# Patient Record
Sex: Male | Born: 1935 | Race: White | Hispanic: No | Marital: Married | Smoking: Never smoker
Health system: Southern US, Community
[De-identification: ages and names within clinical notes are randomized; demographics above are authoritative.]

## PROBLEM LIST (undated history)

## (undated) DIAGNOSIS — E119 Type 2 diabetes mellitus without complications: Secondary | ICD-10-CM

## (undated) DIAGNOSIS — N189 Chronic kidney disease, unspecified: Secondary | ICD-10-CM

## (undated) DIAGNOSIS — E785 Hyperlipidemia, unspecified: Secondary | ICD-10-CM

## (undated) DIAGNOSIS — I252 Old myocardial infarction: Secondary | ICD-10-CM

## (undated) DIAGNOSIS — I10 Essential (primary) hypertension: Secondary | ICD-10-CM

## (undated) DIAGNOSIS — D649 Anemia, unspecified: Secondary | ICD-10-CM

## (undated) HISTORY — PX: THUMB AMPUTATION: SHX804

---

## 2005-01-23 ENCOUNTER — Ambulatory Visit: Payer: Self-pay | Admitting: General Surgery

## 2005-01-23 ENCOUNTER — Other Ambulatory Visit: Payer: Self-pay

## 2005-01-25 ENCOUNTER — Ambulatory Visit: Payer: Self-pay | Admitting: General Surgery

## 2005-04-12 ENCOUNTER — Ambulatory Visit: Payer: Self-pay

## 2006-10-08 ENCOUNTER — Emergency Department: Payer: Self-pay

## 2006-10-08 ENCOUNTER — Other Ambulatory Visit: Payer: Self-pay

## 2006-10-10 ENCOUNTER — Ambulatory Visit: Payer: Self-pay

## 2008-12-15 ENCOUNTER — Ambulatory Visit: Payer: Self-pay | Admitting: Family Medicine

## 2009-11-27 ENCOUNTER — Ambulatory Visit: Payer: Self-pay | Admitting: Urology

## 2011-10-22 ENCOUNTER — Ambulatory Visit: Payer: Self-pay | Admitting: Family Medicine

## 2012-10-26 ENCOUNTER — Ambulatory Visit: Payer: Self-pay | Admitting: Ophthalmology

## 2012-10-26 LAB — CBC WITH DIFFERENTIAL/PLATELET
Basophil %: 0.7 %
Eosinophil %: 4.6 %
HCT: 32 % — ABNORMAL LOW (ref 40.0–52.0)
HGB: 10.9 g/dL — ABNORMAL LOW (ref 13.0–18.0)
Lymphocyte #: 1.5 10*3/uL (ref 1.0–3.6)
Lymphocyte %: 24.5 %
MCH: 35 pg — ABNORMAL HIGH (ref 26.0–34.0)
Monocyte %: 6.6 %
Neutrophil #: 4 10*3/uL (ref 1.4–6.5)
Platelet: 179 10*3/uL (ref 150–440)
WBC: 6.3 10*3/uL (ref 3.8–10.6)

## 2012-11-02 ENCOUNTER — Ambulatory Visit: Payer: Self-pay | Admitting: Ophthalmology

## 2013-03-10 ENCOUNTER — Ambulatory Visit: Payer: Self-pay | Admitting: Family Medicine

## 2014-02-09 ENCOUNTER — Ambulatory Visit: Payer: Self-pay | Admitting: Family Medicine

## 2014-04-12 ENCOUNTER — Inpatient Hospital Stay: Payer: Self-pay | Admitting: Internal Medicine

## 2014-04-12 DIAGNOSIS — I440 Atrioventricular block, first degree: Secondary | ICD-10-CM

## 2014-04-12 DIAGNOSIS — R748 Abnormal levels of other serum enzymes: Secondary | ICD-10-CM

## 2014-04-12 DIAGNOSIS — I951 Orthostatic hypotension: Secondary | ICD-10-CM

## 2014-04-12 DIAGNOSIS — I10 Essential (primary) hypertension: Secondary | ICD-10-CM

## 2014-04-12 LAB — COMPREHENSIVE METABOLIC PANEL
ALBUMIN: 3 g/dL — AB (ref 3.4–5.0)
Alkaline Phosphatase: 96 U/L
Anion Gap: 5 — ABNORMAL LOW (ref 7–16)
BUN: 28 mg/dL — ABNORMAL HIGH (ref 7–18)
Bilirubin,Total: 0.5 mg/dL (ref 0.2–1.0)
CALCIUM: 8.7 mg/dL (ref 8.5–10.1)
CO2: 24 mmol/L (ref 21–32)
CREATININE: 1.37 mg/dL — AB (ref 0.60–1.30)
Chloride: 108 mmol/L — ABNORMAL HIGH (ref 98–107)
EGFR (African American): 55 — ABNORMAL LOW
EGFR (Non-African Amer.): 47 — ABNORMAL LOW
GLUCOSE: 108 mg/dL — AB (ref 65–99)
OSMOLALITY: 280 (ref 275–301)
Potassium: 5.2 mmol/L — ABNORMAL HIGH (ref 3.5–5.1)
SGOT(AST): 80 U/L — ABNORMAL HIGH (ref 15–37)
SGPT (ALT): 25 U/L (ref 12–78)
Sodium: 137 mmol/L (ref 136–145)
Total Protein: 6.3 g/dL — ABNORMAL LOW (ref 6.4–8.2)

## 2014-04-12 LAB — URINALYSIS, COMPLETE
BACTERIA: NONE SEEN
BILIRUBIN, UR: NEGATIVE
Glucose,UR: NEGATIVE mg/dL (ref 0–75)
Leukocyte Esterase: NEGATIVE
Nitrite: NEGATIVE
PH: 5 (ref 4.5–8.0)
PROTEIN: NEGATIVE
Specific Gravity: 1.017 (ref 1.003–1.030)
Squamous Epithelial: NONE SEEN
WBC UR: NONE SEEN /HPF (ref 0–5)

## 2014-04-12 LAB — TROPONIN I
Troponin-I: 0.18 ng/mL — ABNORMAL HIGH
Troponin-I: 0.25 ng/mL — ABNORMAL HIGH
Troponin-I: 0.29 ng/mL — ABNORMAL HIGH

## 2014-04-12 LAB — CBC
HCT: 28.3 % — ABNORMAL LOW (ref 40.0–52.0)
HGB: 9.6 g/dL — ABNORMAL LOW (ref 13.0–18.0)
MCH: 35.3 pg — AB (ref 26.0–34.0)
MCHC: 33.8 g/dL (ref 32.0–36.0)
MCV: 105 fL — AB (ref 80–100)
Platelet: 176 10*3/uL (ref 150–440)
RBC: 2.71 10*6/uL — ABNORMAL LOW (ref 4.40–5.90)
RDW: 15.4 % — ABNORMAL HIGH (ref 11.5–14.5)
WBC: 8.5 10*3/uL (ref 3.8–10.6)

## 2014-04-12 LAB — POTASSIUM: Potassium: 4 mmol/L (ref 3.5–5.1)

## 2014-04-13 LAB — COMPREHENSIVE METABOLIC PANEL
ALT: 61 U/L (ref 12–78)
Albumin: 2.4 g/dL — ABNORMAL LOW (ref 3.4–5.0)
Alkaline Phosphatase: 79 U/L
Anion Gap: 9 (ref 7–16)
BILIRUBIN TOTAL: 0.3 mg/dL (ref 0.2–1.0)
BUN: 28 mg/dL — AB (ref 7–18)
CHLORIDE: 110 mmol/L — AB (ref 98–107)
CREATININE: 1.27 mg/dL (ref 0.60–1.30)
Calcium, Total: 7.8 mg/dL — ABNORMAL LOW (ref 8.5–10.1)
Co2: 21 mmol/L (ref 21–32)
EGFR (Non-African Amer.): 52 — ABNORMAL LOW
GFR CALC AF AMER: 60 — AB
Glucose: 85 mg/dL (ref 65–99)
Osmolality: 284 (ref 275–301)
POTASSIUM: 3.7 mmol/L (ref 3.5–5.1)
SGOT(AST): 220 U/L — ABNORMAL HIGH (ref 15–37)
Sodium: 140 mmol/L (ref 136–145)
Total Protein: 4.9 g/dL — ABNORMAL LOW (ref 6.4–8.2)

## 2014-04-13 LAB — CBC WITH DIFFERENTIAL/PLATELET
BASOS ABS: 0 10*3/uL (ref 0.0–0.1)
Basophil %: 0.1 %
EOS PCT: 0 %
Eosinophil #: 0 10*3/uL (ref 0.0–0.7)
HCT: 26.6 % — ABNORMAL LOW (ref 40.0–52.0)
HGB: 8.8 g/dL — ABNORMAL LOW (ref 13.0–18.0)
Lymphocyte #: 0.4 10*3/uL — ABNORMAL LOW (ref 1.0–3.6)
Lymphocyte %: 5.7 %
MCH: 34.6 pg — AB (ref 26.0–34.0)
MCHC: 33.2 g/dL (ref 32.0–36.0)
MCV: 104 fL — ABNORMAL HIGH (ref 80–100)
Monocyte #: 0.3 x10 3/mm (ref 0.2–1.0)
Monocyte %: 4.7 %
Neutrophil #: 6.5 10*3/uL (ref 1.4–6.5)
Neutrophil %: 89.5 %
Platelet: 111 10*3/uL — ABNORMAL LOW (ref 150–440)
RBC: 2.56 10*6/uL — AB (ref 4.40–5.90)
RDW: 15.2 % — AB (ref 11.5–14.5)
WBC: 7.3 10*3/uL (ref 3.8–10.6)

## 2014-04-15 LAB — CBC WITH DIFFERENTIAL/PLATELET
BASOS PCT: 0.4 %
Basophil #: 0 10*3/uL (ref 0.0–0.1)
EOS PCT: 3.9 %
Eosinophil #: 0.2 10*3/uL (ref 0.0–0.7)
HCT: 30.5 % — ABNORMAL LOW (ref 40.0–52.0)
HGB: 10.1 g/dL — ABNORMAL LOW (ref 13.0–18.0)
Lymphocyte #: 1.2 10*3/uL (ref 1.0–3.6)
Lymphocyte %: 20.5 %
MCH: 34 pg (ref 26.0–34.0)
MCHC: 33.1 g/dL (ref 32.0–36.0)
MCV: 103 fL — ABNORMAL HIGH (ref 80–100)
MONOS PCT: 7.3 %
Monocyte #: 0.4 x10 3/mm (ref 0.2–1.0)
NEUTROS ABS: 3.9 10*3/uL (ref 1.4–6.5)
Neutrophil %: 67.9 %
Platelet: 151 10*3/uL (ref 150–440)
RBC: 2.96 10*6/uL — AB (ref 4.40–5.90)
RDW: 15.3 % — ABNORMAL HIGH (ref 11.5–14.5)
WBC: 5.8 10*3/uL (ref 3.8–10.6)

## 2014-04-17 LAB — CULTURE, BLOOD (SINGLE)

## 2014-10-23 IMAGING — CR DG CHEST 2V
1 series · 3 of 3 positions shown · non-contrast
Comparison: April 12, 2014.

CLINICAL DATA: Shortness of breath.

EXAM:
CHEST  2 VIEW

[Series 3: x chest ap · 0.14mm/px · 3 of 3 slices shown]
[im 1/3]
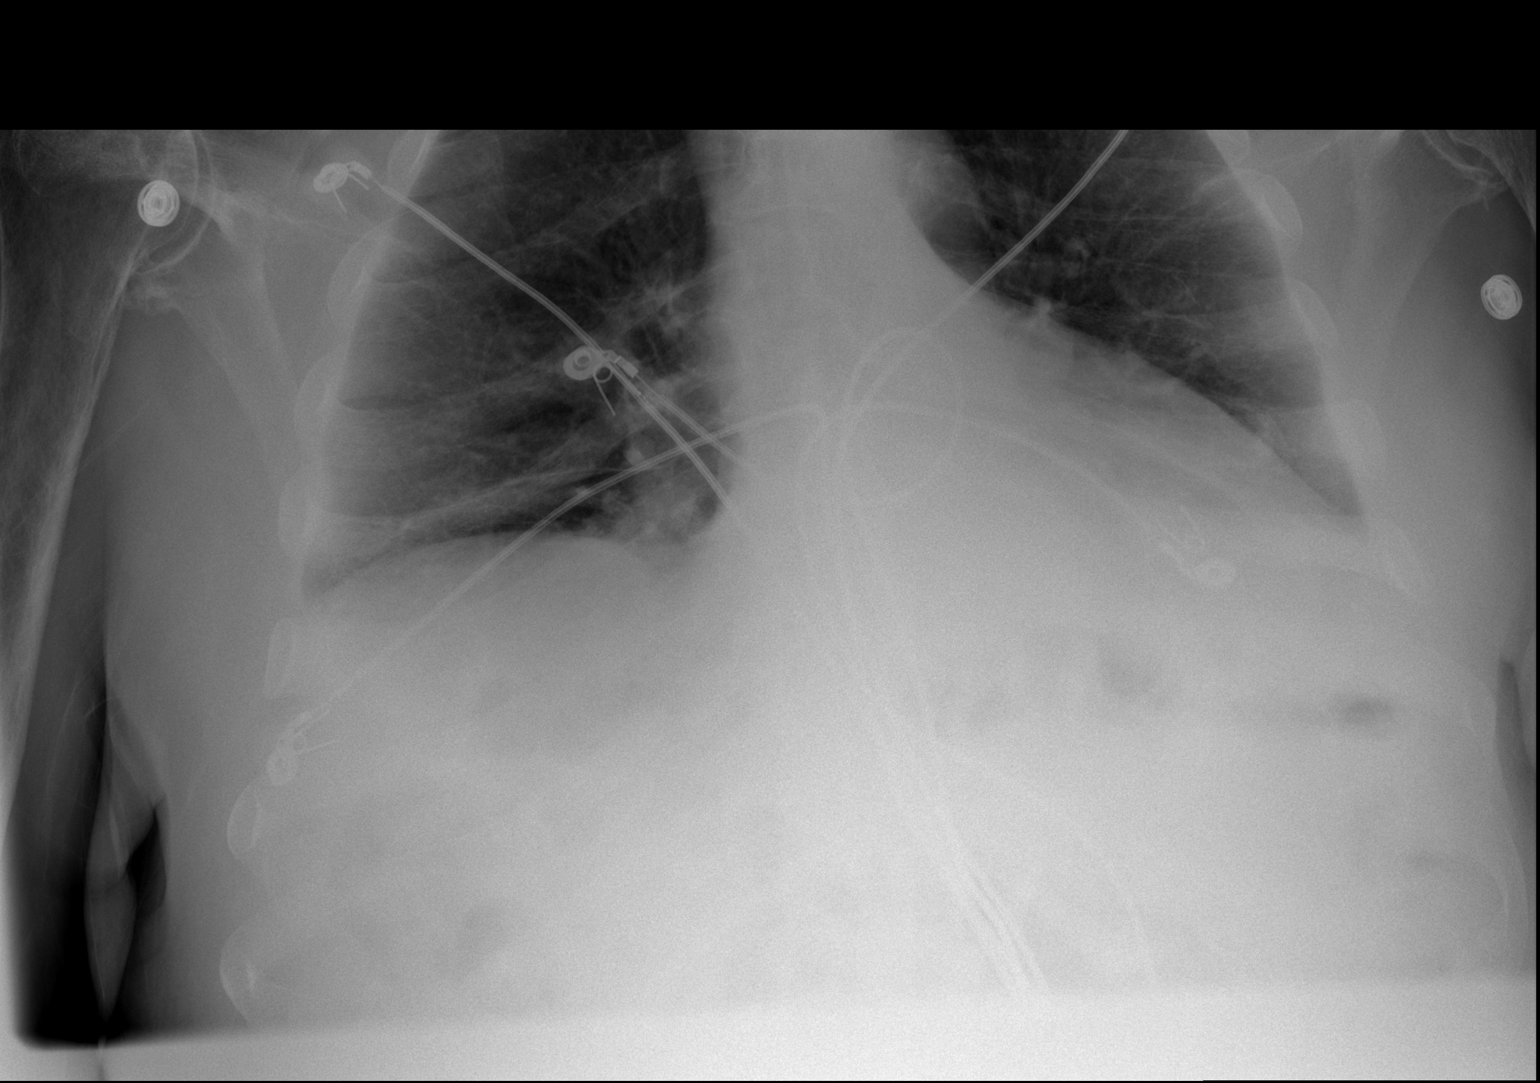
[im 2/3]
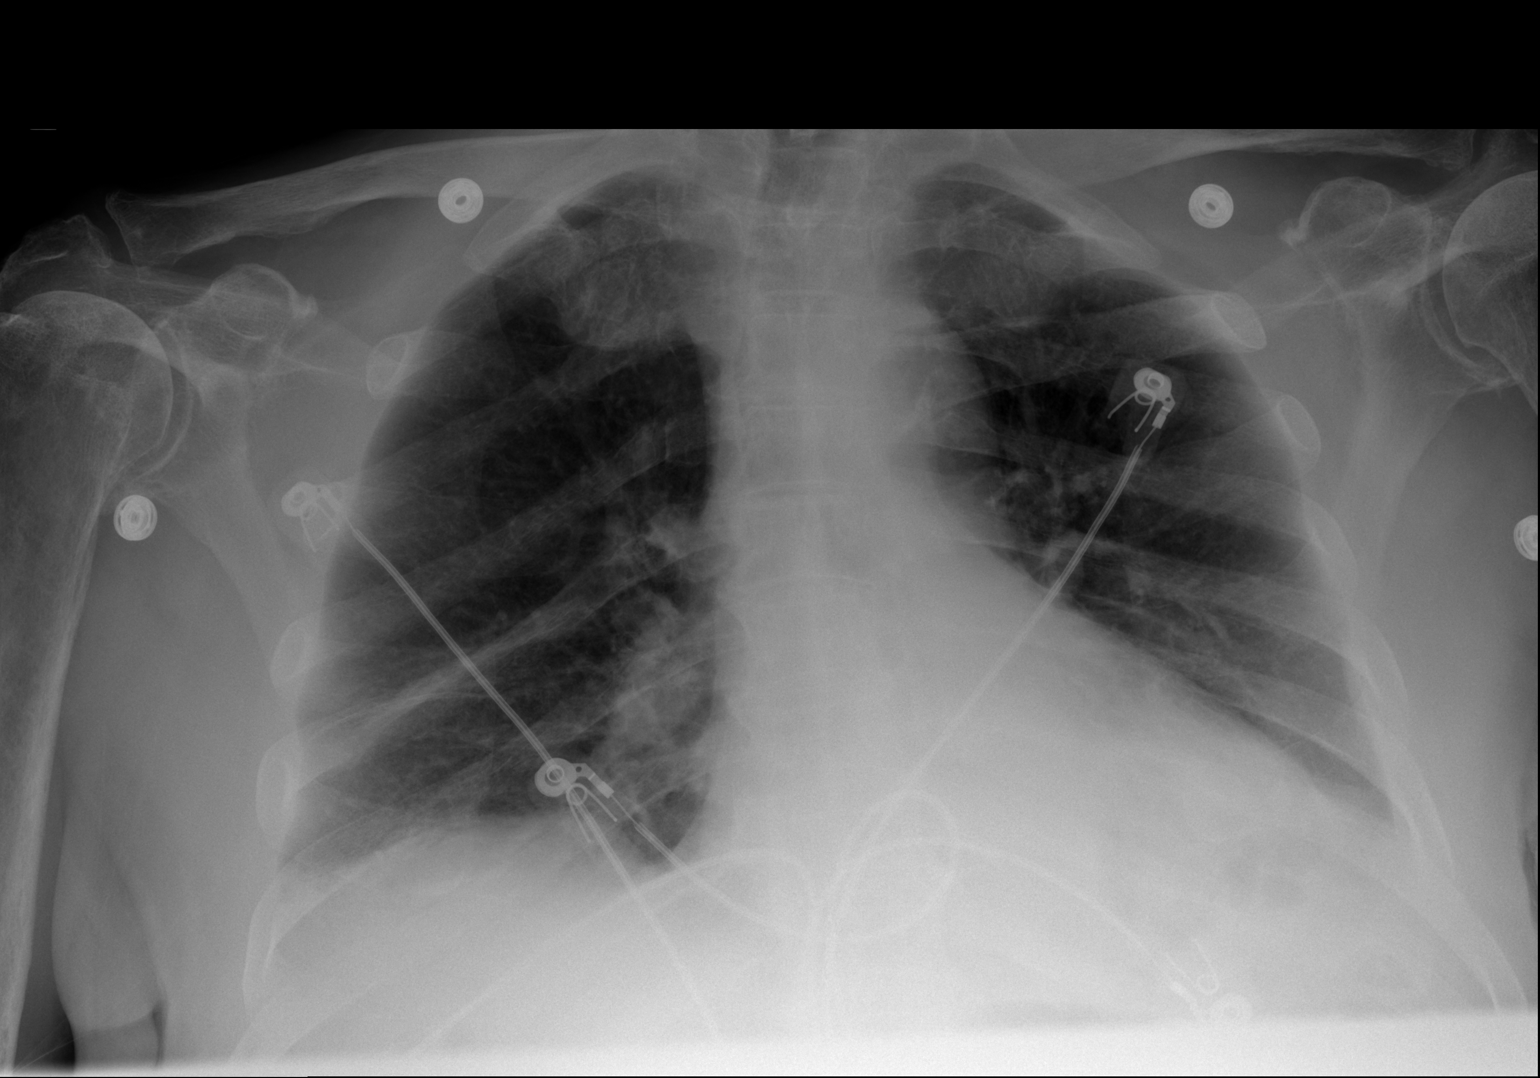
[im 3/3]
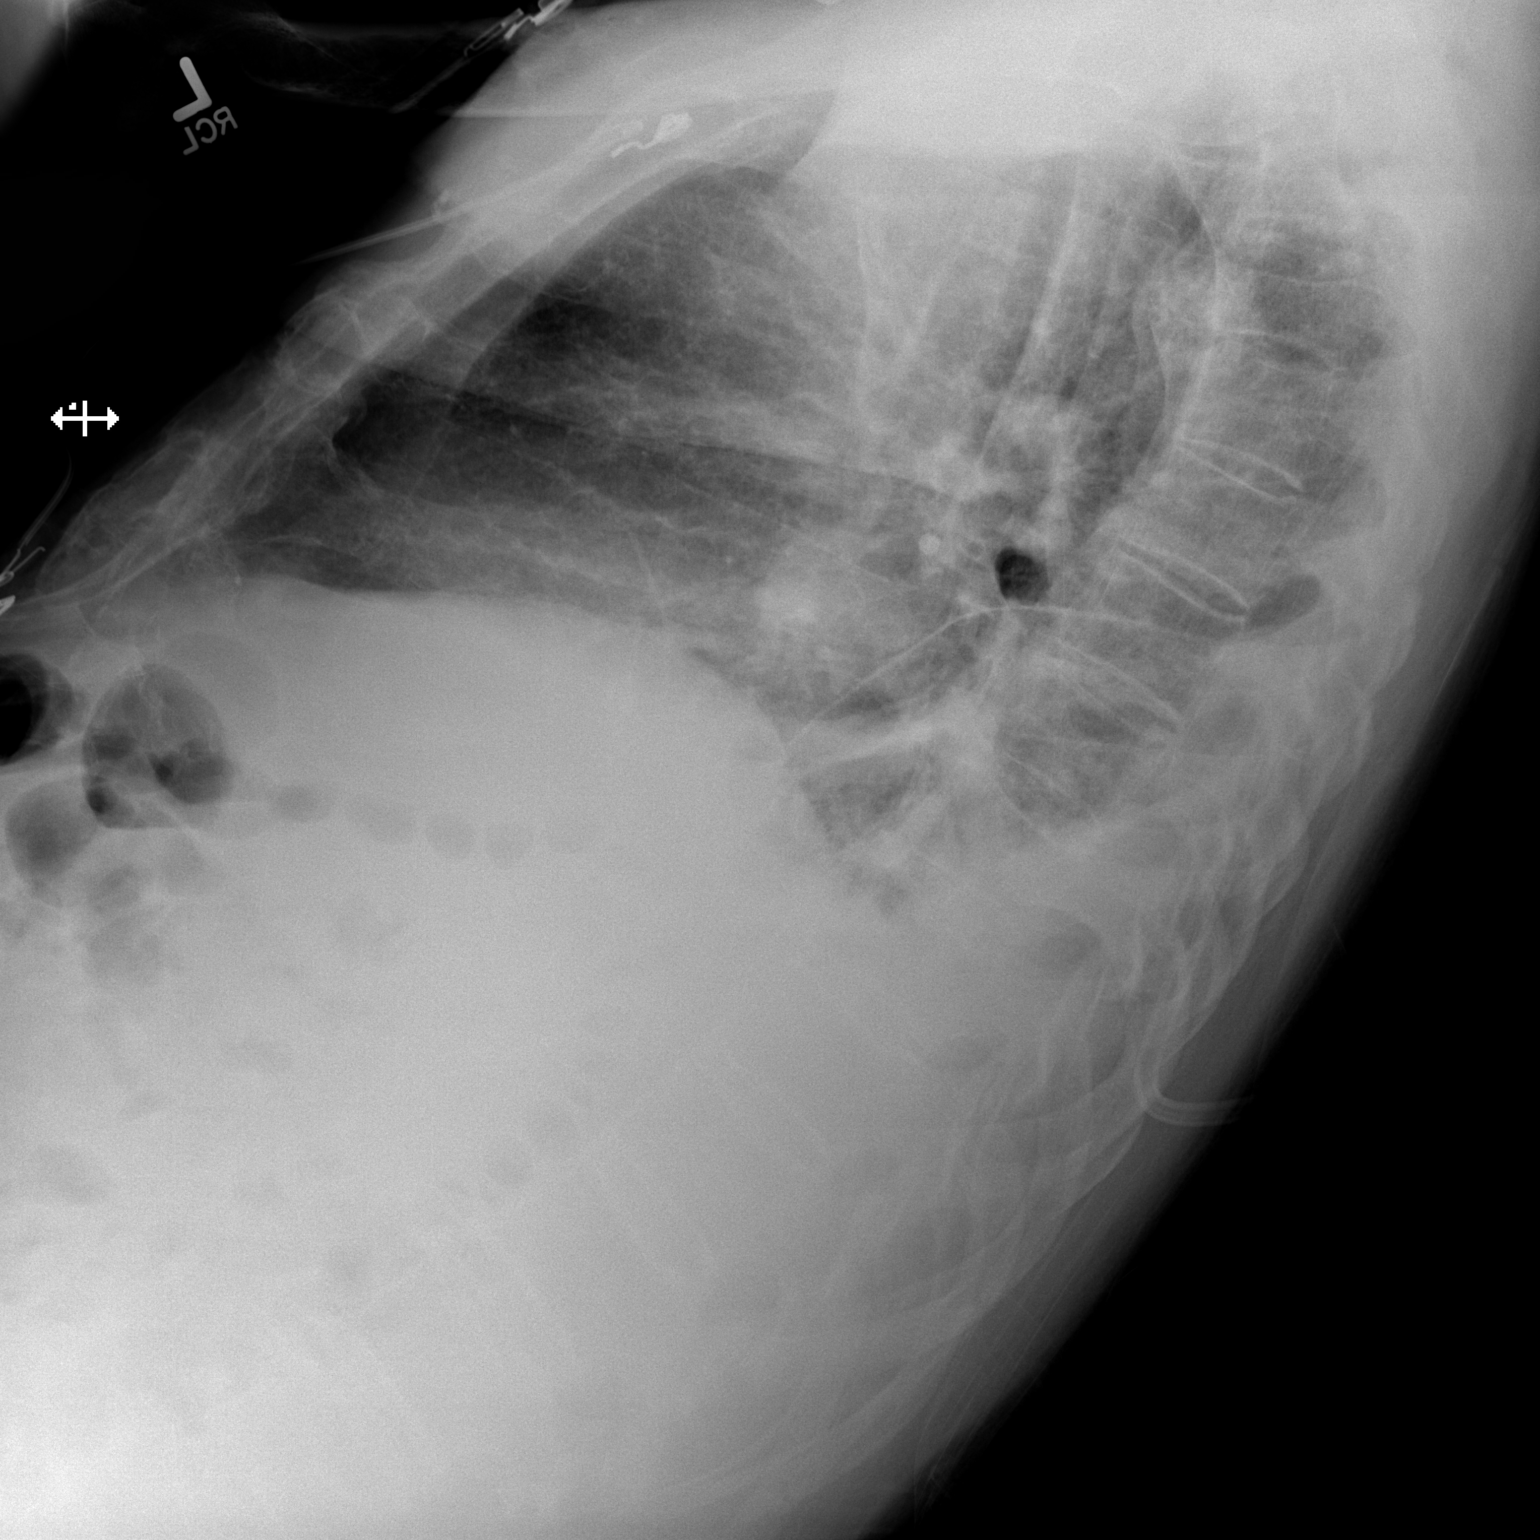

[3 of 3 positions shown; findings below may reference images not displayed]

FINDINGS: Stable cardiomediastinal silhouette. No pneumothorax is noted.
Minimal bilateral pleural effusions are noted. Stable minimal
bilateral basilar opacities are noted most consistent with
subsegmental atelectasis. Bony thorax is intact.
IMPRESSION: Stable bibasilar opacities consistent with subsegmental atelectasis.
No significant change compared to prior exam.

## 2015-01-06 ENCOUNTER — Inpatient Hospital Stay: Payer: Self-pay | Admitting: Internal Medicine

## 2015-03-07 NOTE — Op Note (Signed)
PATIENT NAME:  Evan Fernandez, Evan Fernandez MR#:  [id number] DATE OF BIRTH:  August 03, 1928  DATE OF PROCEDURE:  11/02/2012  PREOPERATIVE DIAGNOSIS:  Cataract, left eye.   POSTOPERATIVE DIAGNOSIS:  Cataract, left eye.   PROCEDURE PERFORMED:  Extracapsular cataract extraction using phacoemulsification with placement Alcon [id number], 16.5 diopter posterior chamber lens, serial number [id number].   SURGEON:  Maylon PeppersSteven A. Karie Skowron, MD   ANESTHESIA: 4% lidocaine and 0.75% Marcaine in a 50:50 mixture with 10 units per mL of Hylenex added, given as a peri-bulbar.   ANESTHESIOLOGIST:  Yves DillPaul Carroll, MD  COMPLICATIONS:  None.   ESTIMATED BLOOD LOSS:  Less than 1 mL.   DESCRIPTION OF PROCEDURE:  The patient was brought to the operating room and given a peribulbar block.  The patient was then prepped and draped in the usual fashion.  The vertical rectus muscles were imbricated using 5-0 silk sutures.  These sutures were then clamped to the sterile drapes as bridle sutures.  A limbal peritomy was performed extending two clock hours and hemostasis was obtained with cautery.  A partial thickness scleral groove was made at the surgical limbus and dissected anteriorly in a lamellar dissection using an Alcon crescent knife.  The anterior chamber was entered supero-temporally with a Superblade and through the lamellar dissection with a 2.6 mm keratome.  DisCoVisc was used to replace the aqueous and a continuous tear capsulorrhexis was carried out.  Hydrodissection and hydrodelineation were carried out with balanced salt and a 27 gauge canula.  The nucleus was rotated to confirm the effectiveness of the hydrodissection.  Phacoemulsification was carried out using a divide-and-conquer technique.  Total left eye ultrasound time was 1 minute and 17 seconds with an average power of 19.5 percent.  A CDE of 27.15.  A suture was placed.     Irrigation/aspiration was used to remove the residual cortex.  DisCoVisc was used to inflate the  capsule and the internal incision was enlarged to 3 mm with the crescent knife.  The intraocular lens was folded and inserted into the capsular bag using the AcrySert Delivery System was used.  Irrigation/aspiration was used to remove the residual DisCoVisc.  Miostat was injected into the anterior chamber through the paracentesis track to inflate the anterior chamber and induce miosis.  The wound was checked for leaks and wound leakage was found.  A single 10-0 suture was placed across the incision, tied and the knot was rotated superiorly.  The conjunctiva was closed with cautery and the bridle sutures were removed.  Two drops of 0.3% Vigamox were placed on the eye.   An eye shield was placed on the eye.    The patient was discharged to the recovery room in good condition.    ____________________________ Maylon PeppersSteven A. Elenor Wildes, MD sad:es D: 11/02/2012 13:20:50 ET T: 11/03/2012 12:12:41 ET JOB#: [id number]  cc: Viviann SpareSteven A. Naima Veldhuizen, MD, <Dictator> Erline LevineSTEVEN A Heavan Francom MD ELECTRONICALLY SIGNED 11/09/2012 12:48

## 2015-03-11 NOTE — H&P (Signed)
PATIENT NAME:  Evan Fernandez, Evan Fernandez MR#:  [id number] DATE OF BIRTH:  [birth year]  DATE OF ADMISSION:  04/12/2014  PRIMARY CARE PHYSICIAN: Stann Mainlandavid P. Sampson GoonFitzgerald, MD  HISTORY OF PRESENT ILLNESS: The patient is an 81 year old Caucasian male with a past medical history significant for history of diabetes mellitus, who comes to the hospital with complaints of increasing weakness, fevers for the past 2 days as well as chills and shortness of breath as well as coughing, with no significant sputum production. In the Emergency Room, he was noted to have O2 saturations ranging from 88% to 91% on room air. He was also tachycardic, with heart rate of 105 initially, and febrile with temperature of 100.8. Chest x-ray revealed bibasilar opacities, concerning for pneumonia, and hospitalist services were contacted for admission. His lactic acid level was also noted to be elevated.  PAST MEDICAL HISTORY: Significant for:  1. History of hypertension.  2. Hyperlipidemia.  3. Gout.  4. Diabetes mellitus.   MEDICATIONS: According to medical records, the patient is on:  1. Allopurinol 100 mg p.o. daily.  2. Aspirin 325 mg 2 tablets once daily as needed.  3. Glipizide/metformin 5/500 mg 1 tablet twice daily.  4. Lisinopril 5 mg p.o. daily.  5. Lovastatin 20 mg p.o. daily. 6. Pioglitazone 45 mg p.o. daily.  PAST SURGICAL HISTORY: None; however, the patient apparently cut off his thumb, also broken ribs on the right side, according to the patient's family, a few months ago.   ALLERGIES: None.   FAMILY HISTORY: Diabetes mellitus in the family. The patient's mother died of operation at an early age, and dad had skull fracture. No known cancer.   SOCIAL HISTORY: The patient is married, lives with his wife. He used to smoke, quit at approximately 81 years old. Dipped snuff. He also drank alcohol; however, quit that at the same age, around [age]. He has 4 children who live close by.   REVIEW OF SYSTEMS:  CONSTITUTIONAL:  Positive for chills yesterday, fatigue and weakness over the past few days. Fell down yesterday and was unable to get up. The patient's wife was able to get him up somewhat and get him back to the house. He fell down on the grass and hit his head; however, today in the morning, he fell out from the bed and was unable to get up. He was also short of breath.  EYES: No problems with his eyes. No blurry vision, double vision, glaucoma or cataracts.  EARS, NOSE, THROAT: Denies any tinnitus, allergies, epistaxis, sinus pain, dentures, difficulty swallowing. Admits to some sinus congestion. RESPIRATORY: Admits of cough. No wheezes. No hemoptysis. Admits to shortness of breath. No painful respirations.  CARDIOVASCULAR: No chest pain. No orthopnea, edema, arrhythmias, palpitations or syncope.  GASTROINTESTINAL: Denies any vomiting, diarrhea, hematemesis or nausea. Denies any constipation, rectal bleeding, change in bowel habits.  GENITOURINARY: Denies dysuria, hematuria, frequency or incontinence.  ENDOCRINOLOGY: Denies any polydipsia, nocturia, thyroid problems, heat or cold intolerance or thirst.  HEMATOLOGIC: Denies any anemia, bruising, bleeding or swollen glands.  SKIN: Denies any acne, rashes, lesions or change in moles.  MUSCULOSKELETAL: Denies arthritis, cramps, swelling or gout. NEUROLOGIC: No numbness, epilepsy or tremors.  PSYCHIATRIC: Denies anxiety, insomnia, depression.   PHYSICAL EXAMINATION:  VITAL SIGNS: On arrival to the hospital, temperature was 100.8, pulse was 98 to 105, respiratory rate was 22, blood pressure 135/58, saturation was 88% to 91% on room air.  GENERAL: This is a well-developed, well-nourished Caucasian male, pale, lying on the stretcher.  HEENT: His pupils are equally reactive to light. Extraocular movements intact. No icterus or conjunctivitis. Has normal hearing. No pharyngeal erythema. Mucosa is very dry. He is edentulous.  NECK: No masses. Supple, nontender. Thyroid  not enlarged. No adenopathy. No JVD or carotid bruits bilaterally. Full range of motion.  LUNGS: Crackles at bases, a few rhonchi were heard. No diminished breath sounds or wheezing. No labored inspiration, increased effort, dullness to percussion. Not in overt respiratory distress; however, the patient is minimally tachypneic on evaluation.  CARDIOVASCULAR: S1, S2 appreciated. Intermittently irregular. Murmur was heard, systolic, radiating to axilla. PMI not lateralized. Chest is nontender to palpation. 1+ pedal pulses. Trace pedal edema. No clubbing or cyanosis was noted.  ABDOMEN: Soft, nontender. Bowel sounds are present. No hepatosplenomegaly or masses were noted.  RECTAL: Deferred.  MUSCLE STRENGTH: Able to move all extremities. No cyanosis, degenerative joint disease. Not able to assess him for kyphosis. Gait not tested.  SKIN: Did not reveal any rashes, lesions, erythema, nodularity or induration. It was warm and dry to palpation.  LYMPHATIC: No adenopathy in the cervical region.  NEUROLOGICAL: Cranial nerves grossly intact. Sensory grossly intact. The patient is dysarthric due to dry mouth, edentulous and some slurring of speech. He is alert, cooperative, but memory is somewhat impaired, but no significant confusion, agitation or depression was noted.   DIAGNOSTIC STUDIES: The patient's EKG done on admission showed accelerated junctional rhythm at 94 beats per minute, left axis deviation, nonspecific ST-T changes were noted. On telemetry, the patient's rhythm is irregular; however, EKG seemed to be regular, consistent with junctional rhythm versus first-degree AV block with difficult-to-discern P waves. The patient's lab data done on admission to the Emergency Room showed glucose of 108, BUN and creatinine were 28 and 1.37, potassium 5.2, otherwise BMP was unremarkable. The patient's liver enzymes: Albumin level of 3.0, total protein was 6.3 and AST is elevated at 80. The patient's white blood  cell count is normal at 8.5, hemoglobin was 9.6, platelet count 176, high MCV at 105. The patient's urinalysis revealed 3+ blood, 6 red blood cells, no white blood cells were seen, negative for leukocyte esterase, nitrites or protein. Lactic acid level was checked and was found to be 1.5, which was elevated.   RADIOLOGIC STUDIES: Chest x-ray, PA and lateral, on 04/12/2014 showed low inspiratory volumes and bibasilar opacities, favored to reflect atelectasis. Cardiomegaly and pulmonary vascular congestion without overt edema.   ASSESSMENT AND PLAN:  1. Systemic inflammatory response syndrome. Admit the patient to medical floor. Get blood cultures as well as sputum cultures if possible. Start patient on Levaquin.  2. Pneumonia. Get sputum cultures, as mentioned above. Will continue Levaquin as well as DuoNebs as needed to facilitate cough and clearing of sputum.  3. Hypertension. Will hold ACE inhibitor due to renal insufficiency and follow the patient's blood pressure readings as well as kidney function.  4. Renal insufficiency. Will continue IV fluids. Will follow creatinine in the morning.  5. Tobacco abuse. Discussed cessation for approximately 3 minutes. The patient does not smoke; however, dips snuff. Will start the patient on nicotine replacement therapy.   TIME SPENT: 50 minutes.   ____________________________ Katharina Caper, MD rv:lb D: 04/12/2014 11:04:55 ET T: 04/12/2014 11:49:20 ET JOB#: [id number]  cc: Katharina Caper, MD, <Dictator> Stann Mainland. Sampson Goon, MD Katharina Caper MD ELECTRONICALLY SIGNED 05/14/2014 17:04

## 2015-03-11 NOTE — Consult Note (Signed)
General Aspect 81 y/o male w/o prior cardiac hx who was admitted today w/ SIRS and found to have an elevated troponin. *************************   Present Illness . 81 y/o male w/o a prior cardiac hx.  He does have a h/o HTN, HL, DM, and gout.  Despite his age, he is very active @ home.  He still push mows his yard and is able to do so w/o c/p or dyspnea.  Yesterday, after mowing his yard, he was sitting and developed chills and wkns.  This was later followed by fever.  He went to bed early and at one point in the night, he rolled out of bed.  His wife called their son and he was taken to [facility].  Here, ECG showed sinus with a 1st deg avb and a left axis.  CXR showed bibasilar opacities and pulm vasc congestion.  RA sat of 88-91%.  He was febrile with a temp of 100.8.  Lactate elevated.  WBC wnl but otw anemic.  Trop elev @ 0.18.  He was admitted by IM and placed on abx for SIRS.  We have been asked to see 2/2 concern for accelerated jxnl (though this is sinus w/ 1st deg) and elev troponin.  Pt says he feels rough but denies c/p or dyspnea at present.   Physical Exam:  GEN well developed, pleasant, nad.   HEENT pink conjunctivae, dry oral mucosa   NECK supple  No masses  no bruits/jvd.   RESP normal resp effort  diminished breath sounds on right, left basilar crackles.   CARD Regular rate and rhythm  Normal, S1, S2  No murmur   ABD denies tenderness  soft  normal BS   LYMPH negative neck   EXTR negative cyanosis/clubbing, negative edema   SKIN normal to palpation, warm   NEURO grossly intact, nonfocal.   PSYCH alert, A+O to time, place, person, good insight   Review of Systems:  Subjective/Chief Complaint SOB, malaise, cough   General: Weight loss or gain  Fatigue  Fever/chills  Weakness   Skin: No Complaints   ENT: No Complaints   Eyes: No Complaints   Neck: No Complaints   Respiratory: Frequent cough  Sputum   Cardiovascular: No Complaints  Dyspnea    Gastrointestinal: No Complaints   Genitourinary: No Complaints   Vascular: No Complaints   Musculoskeletal: No Complaints   Neurologic: No Complaints   Hematologic: No Complaints   Endocrine: No Complaints   Psychiatric: No Complaints   Review of Systems: All other systems were reviewed and found to be negative   Medications/Allergies Reviewed Medications/Allergies reviewed   Family & Social History:  Family and Social History:  Family History No premature CAD that he is aware of.  Mother died @ 65 - unknown cause. Father was murdered @ a young age.   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  No etoh/drugs.   Place of Living Home  Lives in [place] with his wife.  Very active around the house.     Gout:    HL:    HTN:    Pneumonia: 12-Apr-2014   Diabetes:        Admit Diagnosis:   SYSTEMIC INFLAMMATORY RESPONSE SYNDROME: Onset Date: 12-Apr-2014, Status: Active, Description: SYSTEMIC INFLAMMATORY RESPONSE SYNDROME  Home Medications: Medication Instructions Status  aspirin 325 mg oral tablet 2 tab(s) orally prn Active  glipiZIDE-metFORMIN 5 mg-500 mg oral tablet 1 tab(s) orally 2 times a day  Active  allopurinol 100 mg oral tablet 1 tab(s) orally once a day Active  lovastatin 20 mg oral tablet 1 tab(s) orally once a day Active  pioglitazone 45 mg oral tablet 1 tab(s) orally once a day Active  lisinopril 5 mg oral tablet 1 tab(s) orally once a day Active   Lab Results:  Hepatic:  26-May-15 08:04   Bilirubin, Total 0.5  Alkaline Phosphatase 96 (45-117 NOTE: New Reference Range 10/08/13)  SGPT (ALT) 25  SGOT (AST)  80  Total Protein, Serum  6.3  Albumin, Serum  3.0  Lab:  26-May-15 09:05   Lactic Acid, Cardiopulmonary  1.5 (Result(s) reported on 12 Apr 2014 at 09:25AM.)  Routine Chem:  26-May-15 08:04   Potassium, Serum  5.2  Result Comment CHEMISTRY LABS - Slight hemolysis, interpret results with  - caution.   Result(s) reported on 12 Apr 2014 at 09:47AM.  Glucose, Serum  108  BUN  28  Creatinine (comp)  1.37  Sodium, Serum 137  Chloride, Serum  108  CO2, Serum 24  Calcium (Total), Serum 8.7  Osmolality (calc) 280  eGFR (African American)  55  eGFR (Non-African American)  47 (eGFR values <2m/min/1.73 m2 may be an indication of chronic kidney disease (CKD). Calculated eGFR is useful in patients with stable renal function. The eGFR calculation will not be reliable in acutely ill patients when serum creatinine is changing rapidly. It is not useful in  patients on dialysis. The eGFR calculation may not be applicable to patients at the low and high extremes of body sizes, pregnant women, and vegetarians.)  Anion Gap  5    12:31   Result Comment TROPONIN - RESULTS VERIFIED BY REPEAT TESTING.  - RESULTS CALLED TO TRACY TOOMBS AT  - 1317 ON 04/12/14..Marland KitchenMarland KitchenCullomburg - READ-BACK PROCESS PERFORMED.  Result(s) reported on 12 Apr 2014 at 01:21PM.    16:40   Potassium, Serum 4.0 (Result(s) reported on 12 Apr 2014 at 06:04PM.)  Cardiac:  26-May-15 12:31   Troponin I  0.18 (0.00-0.05 0.05 ng/mL or less: NEGATIVE  Repeat testing in 3-6 hrs  if clinically indicated. >0.05 ng/mL: POTENTIAL  MYOCARDIAL INJURY. Repeat  testing in 3-6 hrs if  clinically indicated. NOTE: An increase or decrease  of 30% or more on serial  testing suggests a  clinically important change)    16:40   Troponin I  0.25 (0.00-0.05 0.05 ng/mL or less: NEGATIVE  Repeat testing in 3-6 hrs  if clinically indicated. >0.05 ng/mL: POTENTIAL  MYOCARDIAL INJURY. Repeat  testing in 3-6 hrs if  clinically indicated. NOTE: An increase or decrease  of 30% or more on serial  testing suggests a  clinically important change)  Routine UA:  26-May-15 09:10   Color (UA) Yellow  Clarity (UA) Clear  Glucose (UA) Negative  Bilirubin (UA) Negative  Ketones (UA) Trace  Specific Gravity (UA) 1.017  Blood (UA) 3+  pH (UA) 5.0  Protein  (UA) Negative  Nitrite (UA) Negative  Leukocyte Esterase (UA) Negative (Result(s) reported on 12 Apr 2014 at 09:43AM.)  RBC (UA) 6 /HPF  WBC (UA) NONE SEEN  Bacteria (UA) NONE SEEN  Epithelial Cells (UA) NONE SEEN  Mucous (UA) PRESENT (Result(s) reported on 12 Apr 2014 at 09:43AM.)  Routine Hem:  26-May-15 08:04   WBC (CBC) 8.5  RBC (CBC)  2.71  Hemoglobin (CBC)  9.6  Hematocrit (CBC)  28.3  Platelet Count (CBC) 176 (Result(s) reported on 12 Apr 2014 at 09:38AM.)  MCV  105  MCH  35.3  MCHC 33.8  RDW  15.4   EKG:  EKG Interp. by me   Interpretation EKG shows NSR, 94, first deg avb (240 msec), LAD.  No old ecg for comparison.   Additional Comments On tele, he does brady down into the 40's w/o evidence of heart block.    No Known Allergies:   Vital Signs/Nurse's Notes: **Vital Signs.:   26-May-15 18:43  Vital Signs Type Q 4hr  Temperature Temperature (F) 98.3  Celsius 36.8  Pulse Pulse 90  Respirations Respirations 19  Systolic BP Systolic BP 89  Diastolic BP (mmHg) Diastolic BP (mmHg) 34  Mean BP 52  Pulse Ox % Pulse Ox % 95  Pulse Ox Activity Level  At rest  Oxygen Delivery 4L  *Intake and Output.:   Shift 26-May-15 15:00  Grand Totals Intake:  240 Output:  200    Net:  40 24 Hr.:  40  Oral Intake      In:  240  Urine ml     Out:  200  Length of Stay Totals Intake:  240 Output:  200    Net:  40    Impression 1.  SIRS:   Fevers and chills since yesterday with malaise and wkns.  Lactate mildly elevated.  Febrile.  WBC nl.  CXR w/ bibasilar infiltrates. Will start pressors given persistent hypotension despite IVF boluses, norepi started, infusion --Abx per IM.  2.  Elevated troponin:   In setting of # 1.   --No chest pain or dyspnea @ present.  --ECG w/o acute ST/T changes. --Echo pending. --Cont ASA/statin.  BB held in setting of soft BP. --Follow trop trend.  Suspect demand ischemia in setting of acute illness.  If EF nl, would plan myoview after  recovery from acute illness.  3.  First degree AVB:   likely benign.  No evidence of higher grades of heart block on tele thus far.  He does brady down into the 40's, possibly with dozing off.  Follow - will likely benefit from outpt sleep study.  4.  HTN:   BP's soft currently.  Follow.  5.  HL:  On statin.  6.  Macrocytic/hyperchromic anemia:   W/U per IM.  7.  Mild Renal Insufficiency:   in setting of acute illness.  Hydrate slowly.  Follow.   Electronic Signatures: Rogelia Mire (NP)  (Signed 26-May-15 17:08)  Authored: General Aspect/Present Illness, History and Physical Exam, Review of System, Family & Social History, Past Medical History, Home Medications, Labs, EKG , Allergies, Vital Signs/Nurse's Notes, Impression/Plan Ida Rogue (MD)  (Signed 26-May-15 21:02)  Authored: General Aspect/Present Illness, History and Physical Exam, Review of System, Family & Social History, Past Medical History, Health Issues, Labs, EKG , Vital Signs/Nurse's Notes, Impression/Plan  Co-Signer: General Aspect/Present Illness, History and Physical Exam, Review of System, Family & Social History, Past Medical History, Home Medications, Labs, EKG , Allergies, Vital Signs/Nurse's Notes, Impression/Plan   Last Updated: 26-May-15 21:02 by Ida Rogue (MD)

## 2015-03-11 NOTE — Discharge Summary (Signed)
Dates of Admission and Diagnosis:  Date of Admission 12-Apr-2014   Date of Discharge 15-Apr-2014   Admitting Diagnosis Pneumonia   Final Diagnosis 1. Bilateral basal pneumonia 2. Acute respiratory failure 3. AOCD 4. Elevtaed troponin 5. Mild thrombocytopenia 6. Sepsis- POA    Chief Complaint/History of Present Illness HISTORY OF PRESENT ILLNESS: The patient is an 81 year old Caucasian male with a past medical history significant for history of diabetes mellitus, who comes to the hospital with complaints of increasing weakness, fevers for the past 2 days as well as chills and shortness of breath as well as coughing, with no significant sputum production. In the Emergency Room, he was noted to have O2 saturations ranging from 88% to 91% on room air. He was also tachycardic, with heart rate of 105 initially, and febrile with temperature of 100.8. Chest x-ray revealed bibasilar opacities, concerning for pneumonia, and hospitalist services were contacted for admission. His lactic acid level was also noted to be elevated.   Allergies:  No Known Allergies:   Hepatic:  27-May-15 04:10   Bilirubin, Total 0.3  Alkaline Phosphatase 79 (45-117 NOTE: New Reference Range 10/08/13)  SGPT (ALT) 61  SGOT (AST)  220  Total Protein, Serum  4.9  Albumin, Serum  2.4  Routine Chem:  27-May-15 04:10   Glucose, Serum 85  BUN  28  Creatinine (comp) 1.27  Sodium, Serum 140  Potassium, Serum 3.7  Chloride, Serum  110  CO2, Serum 21  Calcium (Total), Serum  7.8  Osmolality (calc) 284  eGFR (African American)  60  eGFR (Non-African American)  52 (eGFR values <64m/min/1.73 m2 may be an indication of chronic kidney disease (CKD). Calculated eGFR is useful in patients with stable renal function. The eGFR calculation will not be reliable in acutely ill patients when serum creatinine is changing rapidly. It is not useful in  patients on dialysis. The eGFR calculation may not be applicable to patients  at the low and high extremes of body sizes, pregnant women, and vegetarians.)  Anion Gap 9  Routine Hem:  27-May-15 04:10   WBC (CBC) 7.3  RBC (CBC)  2.56  Hemoglobin (CBC)  8.8  Hematocrit (CBC)  26.6  Platelet Count (CBC)  111  MCV  104  MCH  34.6  MCHC 33.2  RDW  15.2  Neutrophil % 89.5  Lymphocyte % 5.7  Monocyte % 4.7  Eosinophil % 0.0  Basophil % 0.1  Neutrophil # 6.5  Lymphocyte #  0.4  Monocyte # 0.3  Eosinophil # 0.0  Basophil # 0.0 (Result(s) reported on 13 Apr 2014 at 04:51AM.)   Pertinent Past History:  Pertinent Past History PAST MEDICAL HISTORY: Significant for:  1. History of hypertension.  2. Hyperlipidemia.  3. Gout.  4. Diabetes mellitus.   Hospital Course:  Hospital Course 876with HTN, HL, Tobacco abuse here with PNA, septic shock and Acute resp failure  * Bilateral basal pneumonia with acute respiratory failure and septic shock On IV abx. Cx Negative Stopped IVF. Nebs. Weaned off O2 as tolerated.  * Elevated troponin due to above No focal wall motion abnormalities on echo  * HTN Meds on hold.  * AOCD Monitor  * Mild thrombocytopenia from sepsis Improved  [facility]  Time spent on d/c 40 min   Condition on Discharge Fair   Code Status:  Code Status Full Code   PHYSICAL EXAM ON DISCHARGE:  Physical Exam:  HEENT pink conjunctivae   NECK No masses  thyroid not  tender   RESP normal resp effort  no use of accessory muscles   CARD regular rate   EXTR negative cyanosis/clubbing   DISCHARGE INSTRUCTIONS HOME MEDS:  Medication Reconciliation: Patient's Home Medications at Discharge:     Medication Instructions  aspirin 325 mg oral tablet  2 tab(s) orally prn   glipizide-metformin 5 mg-500 mg oral tablet  1 tab(s) orally 2 times a day   allopurinol 100 mg oral tablet  1 tab(s) orally once a day   lovastatin 20 mg oral tablet  1 tab(s) orally once a day   pioglitazone 45 mg oral tablet  1 tab(s) orally once a day    lisinopril 5 mg oral tablet  1 tab(s) orally once a day   levaquin 250 mg oral tablet  1 tab(s) orally once a day   advair diskus 100 mcg-50 mcg inhalation powder  1 puff(s) inhaled 2 times a day   proair hfa 90 mcg/inh inhalation aerosol  2 puff(s) inhaled 4 times a day, As Needed - for Shortness of Breath     Physician's Instructions:  Home Health? Yes   Home Health Service Physicial Therapy   Diet Low Sodium  Carbohydrate Controlled (ADA) Diet   Activity Limitations As tolerated   Return to Work Not Applicable   Time frame for Follow Up Appointment 1-2 weeks  PCP   Electronic Signatures: Corrado Hymon, Lottie Dawson (MD)  (Signed 08-Jun-15 20:50)  Authored: ADMISSION DATE AND DIAGNOSIS, CHIEF COMPLAINT/HPI, Allergies, PERTINENT LABS, PERTINENT PAST HISTORY, HOSPITAL COURSE, PHYSICAL EXAM ON DISCHARGE, DISCHARGE INSTRUCTIONS HOME MEDS, PATIENT INSTRUCTIONS   Last Updated: 08-Jun-15 20:50 by Alba Destine (MD)

## 2015-03-19 NOTE — Op Note (Signed)
PATIENT NAME:  Evan StackASLEY, Evan Fernandez MR#:  [id number] DATE OF BIRTH:  Dec 27, 1927  DATE OF PROCEDURE:  01/07/2015  PREOPERATIVE DIAGNOSIS: Right thumb dorsal abscess.   POSTOPERATIVE DIAGNOSIS: Right thumb dorsal abscess.   PROCEDURE PERFORMED:  1. Irrigation debridement of right dorsal thumb wound.  2. Incisional wound VAC placement.   SURGEON OF RECORD: Danelle Earthlyobin T. Regina Ganci, MD  ANESTHESIA: MAC.  FLUIDS ADMINISTERED: 400 Crystalloid.   ESTIMATED BLOOD LOSS: 15 mL.   TOTAL TOURNIQUET TIME: 33 minutes.   PREOPERATIVE ANTIBIOTICS: None.   COMPLICATIONS: None.   DISPOSITION: Stable to PACU upon transport.   INDICATION FOR PROCEDURE: This is an 81 year old male who was bitten on his right hand by his dog approximately 6 days ago. He presented today with an open purulent draining wound over his right MCP joint. The wound was cultured in the Emergency Room, and culture and Gram stain results show a polymicrobial infection. He was admitted, placed on IV antibiotics, and orthopedics was consulted. The patient was counseled for irrigation and debridement of his open wound. Risks and benefits were discussed with the risks to include, but not limited to bleeding, further infection, damage to nerves and/or vessels, and the need for additional surgical procedures. He agreed to proceed with surgery after weighing all the risks.   DESCRIPTION OF PROCEDURE: The patient was brought to the operating room and placed on the operating room table with all extremities appropriately padded. The right upper extremity was prepped and draped in a sterile fashion, utilizing a Betadine scrub. A timeout was then performed to anesthesia, nursing staff, and surgeon of record to confirm surgical site, patient name, medical record number, procedures to be performed, and confirmation that all the surgical equipment was in the room. Upon wound exploration, the patient had an approximately 2.5 cm wound overlying his MCP joint. This  was extended both proximally and distally to further expose the tissue to perform an adequate debridement. Wound exploration revealed complete erosion of the dorsal capsule, as well as the extensor pollicis longus tendon with exposed MCP joint. The wound was debrided of any necrotic-appearing tissue, and the wound and MCP joint were copiously irrigated with 2 liters of sterile saline. At this point, the patient had an incompetent dorsal capsule and EPL, and a prominent extensor lag across the MCP joint. A monofilament suture was used to reapproximate the dorsal capsule, as well as tenodese the extensor pollicis longus tendon. The wound was again irrigated with saline and then packed with 1 gram of vancomycin powder. The skin was then loosely approximated with nylon suture, and a silver impregnated incisional wound VAC was placed over top of the wound. The patient was placed in a well-padded thumb spica splint and transferred to the PACU in stable condition.    POSTOPERATIVE PLAN: The patient will remain in his foot incisional wound VAC until Monday, When he will have an incisional VAC take down and then local wound care by the wound care team. Definitive antibiotic treatment will be pending final speciation of his cultures. Additional tissue cultures were sent intraoperatively, as well. I anticipate discharge early next week with long-term antibiotics and wound care follow-up.    ____________________________ Danelle Earthlyobin T. Matson Welch, MD tte:mw D: 01/07/2015 17:39:42 ET T: 01/07/2015 18:37:15 ET JOB#: [id number]  cc: Danelle Earthlyobin T. Sontee Desena, MD, <Dictator> Danelle EarthlyBIN T Lindsey Hommel MD ELECTRONICALLY SIGNED 01/08/2015 19:33

## 2015-03-19 NOTE — Discharge Summary (Signed)
PATIENT NAME:  Evan Fernandez, Evan Fernandez MR#:  [id number] DATE OF BIRTH:  May 18, 1928  DATE OF ADMISSION:  01/06/2015  DATE OF DISCHARGE:  01/09/2015  DISCHARGE DIAGNOSES:  1.  Right hand and of bite status post incision and drainage myotomal.  2.  Hypertension.  3.  Diabetes.   CONDITION ON DISCHARGE: Stable.   MEDICATIONS ON DISCHARGE:  1.  Aspirin 325 mg oral 2 tablets as needed.  2.  Glipizide and metformin 5 mg +500 mg oral tablet 2 times a day.  3.  Allopurinol 100 mg oral tablet once a day.  4.  Lovastatin 20 mg oral once a day.  5.  Pioglitazone 45 mg oral once a day.  6.  Lisinopril 5 mg oral tablet once a day.  7.  Advair Diskus 1 puff inhalation two times a day.  8.  ProAir 2 puffs inhalation 4 times a day as needed for shortness of breath.  9.  Acetaminophen and hydrocodone 325 plus 5 mg oral tablet four times a day as needed for moderate pain.  10.  Augmentin 875 + 125 mg oral tablet two times a day for 10 days.   DIET ON DISCHARGE:  Low-sodium, low carbohydrate, ADA diet and regular consistency.   [facility]   PRIMARY CARE PHYSICIAN:  Dr. Mila Merryonald Fisher is family care physician.   HISTORY OF PRESENT ILLNESS: An 81 year old Caucasian male with history of hypertension and diabetes, presented to Emergency Room with complaint of dog bite on the right hand and right thumb was swollen, tender and red for three days.  The dog bite was five days ago. The patient also noticed some discharge from the right thumb from the wound and so came to the Emergency Room of for that management. He was able to move his hand and all the fingers and thumb.  He had diabetes type 2 and blood sugar was around 100 at home.  Admitted for further management of his cellulitis.   HOSPITAL COURSE:    1.  Right hand dog bite causing cellulitis and abscess. He was started on broad-spectrum vancomycin and Zosyn.  Orthopedic surgeon saw the patient and they did  I and D on his thumb and placed a wound VAC for one day.  He had significant improvement in the wound so finally the wound VAC was removed by ortho dressing instructions were given and he was discharged home with oral antibiotic course for 10 more days and advised to follow in orthopedic clinic in one week.  2.  Hypertension. It was under control with lisinopril.  3.  Diabetes. Insulin sliding scale coverage continued in the hospital.  4.  Anemia of chronic disease. It was stable and we continued monitoring.  5.  Mild dehydration, so we gave some  IV fluid and it became normal after 1 to 2 days.  6.  Bilateral lower extremity edema, unclear etiology likely it was renal stasis, but it was better within a day or two, so we did not further investigate this issue as the patient did not have any complaints.    CONSULT IN THE HOSPITAL:  Orthopedic consult with Dr. Thornton PapasEckel Tobin who [facility] and Dr. Ernest PineHooten.   IMPORTANT LABORATORY RESULTS IN THE HOSPITAL:   X-ray of right hand on presentation was no acute bony deformity noted. On presentation glucose 119, BUN 21, creatinine 1.18, sodium 142, potassium 4.3, chloride 108, and CO2 of 27 with  calcium 8.8. WBC on presentation 5.8, hemoglobin 10, platelet count 180, and MCV is 106. His wound developed moderate growth of staph aureus, which was sensitive to oxacillin. Blood culture was negative. On further follow-up WBC count was 5.4, and hemoglobin remained stable at 8.3 on February 20.  The wound culture which was sent from surgery showed light growth of , rare aerobic gram positive rods, mixed anaerobic organism and rare gram-positive cocci.   TOTAL TIME SPENT ON THIS DISCHARGE:  40 minutes.    ____________________________ Hope Pigeon Elisabeth Pigeon, MD vgv:at D: 01/13/2015 09:05:27 ET T: 01/13/2015 19:49:54 ET JOB#: [id number]  cc: Hope Pigeon. Elisabeth Pigeon, MD, <Dictator> Demetrios Isaacs. Sherrie Mustache, MD Illene Labrador. Angie Fava., MD Altamese Dilling  MD ELECTRONICALLY SIGNED 01/30/2015 12:52

## 2015-03-19 NOTE — H&P (Signed)
PATIENT NAME:  Evan Fernandez, Evan Fernandez MR#:  [id number] DATE OF BIRTH:  Jul 17, 1928  DATE OF ADMISSION:  01/06/2015  PRIMARY CARE PHYSICIAN: Mila Merryonald Fisher, MD  CHIEF COMPLAINT: Right hand dog bite, right thumb swelling, tenderness, and erythema for 3 days.  HISTORY OF PRESENT ILLNESS: This is an 81 year old Caucasian male with a history of hypertension and diabetes who presented to the ED with the above chief complaint. The patient is alert, awake, oriented, in no acute distress. The patient said his right hand was bitten by his dog 5 days ago. He noticed that his right thumb became swollen, red and painful 3 days ago which has been worsening. In addition, he noticed some discharge from the right thumb so he came to ED for further evaluation. The patient denies any fever or chills. Denies any other symptoms. He can move his right hand and 5 fingers. The patient said he has diabetes type 2 and his blood sugar is about 100 at home.  PAST MEDICAL HISTORY: Hypertension, diabetes, hyperlipidemia, gout.  SOCIAL HISTORY: No smoking or drinking or illicit drugs.   PAST SURGICAL HISTORY: Left thumb was cut off a long time ago.   FAMILY HISTORY: Diabetes. Mother died of operation at early age.  ALLERGIES: None.   HOME MEDICATIONS: ProAir HFA 90 mcg 2 puffs inhaled 4 times a day p.r.n., glitazone 45 mg p.o. once a day, lovastatin 20 mg p.o. daily, lisinopril 5 mg p.o. daily, glipizide/metformin 500 mg p.o. tablets twice a day, aspirin 325 mg p.o. 2 times a day p.r.n., allopurinol 100 mg p.o. daily, Advair 100 mcg/50 mcg inhalation 1 puff twice a day.   REVIEW OF SYSTEMS: CONSTITUTIONAL: The patient denies any fever or chills. No headache or dizziness. No weakness.  EYES: No double vision, blurred vision.  ENT: No postnasal drip, slurred speech or dysphagia.  CARDIOVASCULAR: No chest pain, palpitation, orthopnea, or nocturnal dyspnea. No leg edema.  PULMONARY: No cough, sputum, shortness of breath, or  hemoptysis.  GASTROINTESTINAL: No abdominal pain, nausea, vomiting, diarrhea. No melena or bloody stool.  GENITOURINARY: No dysuria, hematuria, or incontinence.  SKIN: No rash or jaundice.  NEUROLOGY: No syncope, loss of consciousness, or seizure.  ENDOCRINOLOGY: No polyuria or polydipsia. No heat or cold intolerance. HEMATOLOGIC: No easy bruising or bleeding.  MUSCULOSKELETAL: Right hand erythema, tenderness and swelling.   PHYSICAL EXAMINATION:  VITAL SIGNS: Temperature 98.2, blood pressure 160/82, pulse 78, O2 saturation 100% in room air.  GENERAL: The patient is alert, awake and oriented, in no acute distress.  HEENT: Pupils round, equal and reactive to light and accommodation. Moist oral mucosa. Clear oropharynx.  NECK: Supple. No JVD or carotid bruits. No lymphadenopathy. No thyromegaly.  CARDIOVASCULAR: S1 and S2 regular rate and rhythm. No murmurs or gallop.  PULMONARY: Bilateral air entry. No wheezing or rales. No use of accessory muscles to breathe.  ABDOMEN: Soft. No distention or tenderness. No organomegaly. Bowel sounds present.  EXTREMITIES: Bilateral lower extremity edema, 1+. No clubbing or cyanosis. No calf tenderness. Right hand swelling, erythema and tenderness around thumb with ulcer and yellowish discharge.  NEUROLOGY: Alert and oriented x3. No focal deficit. Power 5/5. Sensation intact.   DIAGNOSTIC DATA: Glucose 119, BUN 21, creatinine 1.18, sodium 142, potassium 4.3, chloride 108, bicarb 27, albumin 3.1. WBC 5.8, hemoglobin 10, platelets 180,000.   Right hand x-ray did not show any acute bony abnormality.   IMPRESSION: 1.  Right hand dog bite caused cellulitis and abscess.  2.  Hypertension.  3.  Diabetes.  4.  Anemia of chronic disease.  5.  Mild dehydration.  6.  Bilateral lower extremity edema, unclear etiology.   PLAN OF TREATMENT:  1.  The patient will be admitted to the medical floor. I will start vancomycin and Zosyn. In addition, the patient's blood  culture was sent. The patient got tetanus in the ED. According to the patient, the patient's dog vaccine is up to date. I will request a surgical consult for possible debridement and follow up CBC.  2.  For mild dehydration, I will give gentle rehydration.  3.  For diabetes, hold p.o. diabetes medication, start sliding scale, check hemoglobin A1c.   I discussed the patient's condition and plan of treatment with the patient. The patient wants FULL code.   TIME SPENT: About 50 minutes.   ____________________________ Shaune Pollack, MD qc:sb D: 01/06/2015 13:35:58 ET T: 01/06/2015 14:02:34 ET JOB#: [id number]  cc: Shaune Pollack, MD, <Dictator> Shaune Pollack MD ELECTRONICALLY SIGNED 01/06/2015 16:31

## 2015-03-19 NOTE — Consult Note (Signed)
Brief Consult Note: Diagnosis: Right hand abscess.   Patient was seen by consultant.   Consult note dictated.   Recommend to proceed with surgery or procedure.   Orders entered.   Comments: To OR for I&D and possible wound vac placement.  Electronic Signatures: Danelle EarthlyEckel, Dameir Gentzler T (MD)  (Signed [id number]-Feb-16 09:10)  Authored: Brief Consult Note   Last Updated: 20-Feb-16 09:10 by Danelle EarthlyEckel, Sianne Tejada T (MD)

## 2015-03-19 NOTE — Consult Note (Signed)
PATIENT NAME:  Evan Fernandez, Evan Fernandez MR#:  [id number] DATE OF BIRTH:  March 03, 1928  DATE OF CONSULTATION:  01/07/2015  REFERRING PHYSICIAN:   CONSULTING PHYSICIAN:  Danelle Earthlyobin T. Jacqeline Broers, MD  REASON FOR CONSULTATION: Right hand abscess.   HISTORY OF PRESENT ILLNESS: Evan Fernandez is an 81 year old male with multiple medical problems to include gout, hypertension, hyperlipidemia, diabetes, and history pneumonia, who was admitted after being bit on his right thumb by his dog approximately 6 days ago. He said he developed an open wound with increasing erythema and drainage, but denies any fevers or chills.   PAST MEDICAL HISTORY: As above.   MEDICATIONS: Pioglitazone , lovastatin, lisinopril, glipizide, aspirin, and allopurinol.   REVIEW OF SYSTEMS: Noncontributory.   LABORATORY EVALUATION: White count 5.4, hemoglobin and hematocrit of 8.3 and 24.9.   PHYSICAL EXAMINATION: GENERAL: He is an alert and oriented 81 year old male who does not appear to be any acute distress.  EXTREMITIES: His right hand reveals open wound overlying the dorsal aspect of his thumb along the base of the proximal phalanx and Extending over the MCP joint. He does have surrounding erythema and expressible purulent drainage from the wound. He has no pain with flexion of the IP joint, no pain with wrist flexion or extension, and no streaking erythema up his forearm. He has no tenderness overlying the flexor tendon sheath and no pain with passive flexion or extension of the thumb.   RADIOGRAPHIC DATA: X-rays taken of the right hand demonstrate diffuse arthritis, but no other osseous abnormalities.   ASSESSMENT: An 81 year old male with soft tissue infection to his right hand after a dog bite.   PLAN: The patient will go to the operating room today for irrigation and debridement.    ____________________________ Danelle Earthlyobin T. Lyriq Finerty, MD tte:mw D: 01/07/2015 09:09:01 ET T: 01/07/2015 12:07:43 ET JOB#: [id number]  cc: Danelle Earthlyobin T. Jaiveon Suppes, MD,  <Dictator> Danelle EarthlyBIN T Nallely Yost MD ELECTRONICALLY SIGNED 01/07/2015 16:00

## 2015-04-25 ENCOUNTER — Encounter: Payer: Self-pay | Admitting: *Deleted

## 2015-04-25 ENCOUNTER — Emergency Department
Admission: EM | Admit: 2015-04-25 | Discharge: 2015-04-25 | Disposition: A | Discharge status: Home / Self Care | Payer: Medicare Other | Attending: Emergency Medicine | Admitting: Emergency Medicine

## 2015-04-25 DIAGNOSIS — E119 Type 2 diabetes mellitus without complications: Secondary | ICD-10-CM | POA: Diagnosis not present

## 2015-04-25 DIAGNOSIS — R789 Finding of unspecified substance, not normally found in blood: Secondary | ICD-10-CM | POA: Diagnosis present

## 2015-04-25 DIAGNOSIS — E875 Hyperkalemia: Secondary | ICD-10-CM | POA: Insufficient documentation

## 2015-04-25 DIAGNOSIS — Z7951 Long term (current) use of inhaled steroids: Secondary | ICD-10-CM | POA: Insufficient documentation

## 2015-04-25 DIAGNOSIS — I10 Essential (primary) hypertension: Secondary | ICD-10-CM | POA: Insufficient documentation

## 2015-04-25 DIAGNOSIS — Z7982 Long term (current) use of aspirin: Secondary | ICD-10-CM | POA: Diagnosis not present

## 2015-04-25 DIAGNOSIS — Z79899 Other long term (current) drug therapy: Secondary | ICD-10-CM | POA: Insufficient documentation

## 2015-04-25 LAB — CBC WITH DIFFERENTIAL/PLATELET
BASOS ABS: 0 10*3/uL (ref 0–0.1)
Basophils Relative: 1 %
EOS ABS: 0.5 10*3/uL (ref 0–0.7)
EOS PCT: 12 %
HCT: 25.5 % — ABNORMAL LOW (ref 40.0–52.0)
Hemoglobin: 8.2 g/dL — ABNORMAL LOW (ref 13.0–18.0)
LYMPHS PCT: 23 %
Lymphs Abs: 1 10*3/uL (ref 1.0–3.6)
MCH: 34.3 pg — ABNORMAL HIGH (ref 26.0–34.0)
MCHC: 32.1 g/dL (ref 32.0–36.0)
MCV: 106.7 fL — ABNORMAL HIGH (ref 80.0–100.0)
MONO ABS: 0.3 10*3/uL (ref 0.2–1.0)
MONOS PCT: 7 %
Neutro Abs: 2.6 10*3/uL (ref 1.4–6.5)
Neutrophils Relative %: 57 %
PLATELETS: 140 10*3/uL — AB (ref 150–440)
RBC: 2.39 MIL/uL — ABNORMAL LOW (ref 4.40–5.90)
RDW: 16.1 % — AB (ref 11.5–14.5)
WBC: 4.5 10*3/uL (ref 3.8–10.6)

## 2015-04-25 LAB — BASIC METABOLIC PANEL
ANION GAP: 5 (ref 5–15)
Anion gap: 6 (ref 5–15)
BUN: 47 mg/dL — ABNORMAL HIGH (ref 6–20)
BUN: 41 mg/dL — AB (ref 6–20)
CO2: 23 mmol/L (ref 22–32)
CO2: 23 mmol/L (ref 22–32)
CREATININE: 1.49 mg/dL — AB (ref 0.61–1.24)
Calcium: 9 mg/dL (ref 8.9–10.3)
Calcium: 9.1 mg/dL (ref 8.9–10.3)
Chloride: 114 mmol/L — ABNORMAL HIGH (ref 101–111)
Chloride: 116 mmol/L — ABNORMAL HIGH (ref 101–111)
Creatinine, Ser: 1.32 mg/dL — ABNORMAL HIGH (ref 0.61–1.24)
GFR calc Af Amer: 48 mL/min — ABNORMAL LOW (ref >60)
GFR calc Af Amer: 55 mL/min — ABNORMAL LOW (ref >60)
GFR calc non Af Amer: 41 mL/min — ABNORMAL LOW (ref >60)
GFR calc non Af Amer: 47 mL/min — ABNORMAL LOW (ref >60)
GLUCOSE: 75 mg/dL (ref 65–99)
Glucose, Bld: 228 mg/dL — ABNORMAL HIGH (ref 65–99)
POTASSIUM: 5.2 mmol/L — AB (ref 3.5–5.1)
Potassium: 5.6 mmol/L — ABNORMAL HIGH (ref 3.5–5.1)
SODIUM: 143 mmol/L (ref 135–145)
Sodium: 144 mmol/L (ref 135–145)

## 2015-04-25 MED ORDER — SODIUM CHLORIDE 0.9 % IV SOLN
1.0000 g | Freq: Once | INTRAVENOUS | Status: AC
  Administered 2015-04-25: 1 g via INTRAVENOUS
  Filled 2015-04-25: qty 10

## 2015-04-25 MED ORDER — SODIUM POLYSTYRENE SULFONATE 15 GM/60ML PO SUSP
15.0000 g | Freq: Once | ORAL | Status: AC
  Administered 2015-04-25: 15 g via ORAL

## 2015-04-25 MED ORDER — SODIUM CHLORIDE 0.9 % IV BOLUS (SEPSIS)
1000.0000 mL | Freq: Once | INTRAVENOUS | Status: AC
  Administered 2015-04-25: 1000 mL via INTRAVENOUS

## 2015-04-25 MED ORDER — SODIUM POLYSTYRENE SULFONATE 15 GM/60ML PO SUSP
ORAL | Status: AC
Start: 2015-04-25 — End: 2015-04-25
  Administered 2015-04-25: 15 g via ORAL
  Filled 2015-04-25: qty 60

## 2015-04-25 NOTE — ED Notes (Signed)
Patient discharged with review of instructions to follow up with MD tomorrow and carry d/c instructions along with him, verbalized understanding.

## 2015-04-25 NOTE — Discharge Instructions (Signed)
Hyperkalemia Your potassium today was 5.6 and then 5.2 after fluids today.   You were also given a medicine called kayexalate which will bring your potassium down further over the next 12-24 hours.  Bring these papers with you to your follow up visit with Dr. [name].   Hyperkalemia is when you have too much potassium in your blood. This can be a life-threatening condition. Potassium is normally removed (excreted) from the body by the kidneys. CAUSES  The potassium level in your body can become too high for the following reasons:  You take in too much potassium. You can do this by:  Using salt substitutes. They contain large amounts of potassium.  Taking potassium supplements from your caregiver. The dose may be too high for you.  Eating foods or taking nutritional products with potassium.  You excrete too little potassium. This can happen if:  Your kidneys are not functioning properly. Kidney (renal) disease is a very common cause of hyperkalemia.  You are taking medicines that lower your excretion of potassium, such as certain diuretic medicines.  You have an adrenal gland disease called Addison's disease.  You have a urinary tract obstruction, such as kidney stones.  You are on treatment to mechanically clean your blood (dialysis) and you skip a treatment.  You release a high amount of potassium from your cells into your blood. You may have a condition that causes potassium to move from your cells to your bloodstream. This can happen with:  Injury to muscles or other tissues. Most potassium is stored in the muscles.  Severe burns or infections.  Acidic blood plasma (acidosis). Acidosis can result from many diseases, such as uncontrolled diabetes. SYMPTOMS  Usually, there are no symptoms unless the potassium is dangerously high or has risen very quickly. Symptoms may include:  Irregular or very slow heartbeat.  Feeling sick to your stomach (nauseous).  Tiredness  (fatigue).  Nerve problems such as tingling of the skin, numbness of the hands or feet, weakness, or paralysis. DIAGNOSIS  A simple blood test can measure the amount of potassium in your body. An electrocardiogram test of the heart can also help make the diagnosis. The heart may beat dangerously fast or slow down and stop beating with severe hyperkalemia.  TREATMENT  Treatment depends on how bad the condition is and on the underlying cause.  If the hyperkalemia is an emergency (causing heart problems or paralysis), many different medicines can be used alone or together to lower the potassium level briefly. This may include an insulin injection even if you are not diabetic. Emergency dialysis may be needed to remove potassium from the body.  If the hyperkalemia is less severe or dangerous, the underlying cause is treated. This can include taking medicines if needed. Your prescription medicines may be changed. You may also need to take a medicine to help your body get rid of potassium. You may need to eat a diet low in potassium. HOME CARE INSTRUCTIONS   Take medicines and supplements as directed by your caregiver.  Do not take any over-the-counter medicines, supplements, natural products, herbs, or vitamins without reviewing them with your caregiver. Certain supplements and natural food products can have high amounts of potassium. Other products (such as ibuprofen) can damage weak kidneys and raise your potassium.  You may be asked to do repeat lab tests. Be sure to follow these directions.  If you have kidney disease, you may need to follow a low potassium diet. SEEK MEDICAL CARE IF:  You notice an irregular or very slow heartbeat.  You feel lightheaded.  You develop weakness that is unusual for you. SEEK IMMEDIATE MEDICAL CARE IF:   You have shortness of breath.  You have chest discomfort.  You pass out (faint). MAKE SURE YOU:   Understand these instructions.  Will watch your  condition.  Will get help right away if you are not doing well or get worse. Document Released: 10/25/2002 Document Revised: 01/27/2012 Document Reviewed: 02/09/2014 [facility] Patient Information 2015 [place], [place]. This information is not intended to replace advice given to you by your health care provider. Make sure you discuss any questions you have with your health care provider.

## 2015-04-25 NOTE — ED Notes (Signed)
MD at bedside. 

## 2015-04-25 NOTE — ED Notes (Signed)
Pt sent by MD for elevated Potassium

## 2015-04-25 NOTE — ED Provider Notes (Signed)
[facility] Emergency Department Provider Note  ____________________________________________  Time seen: Approximately 1010 AM  I have reviewed the triage vital signs and the nursing notes.   HISTORY  Chief Complaint No chief complaint on file.    HPI Evan Fernandez is a 81 y.o. male with a history of diabetes and hypertension who presents today after being sent from his primary care doctor's office for potassium of 6.8 on outpatient blood work. He says that he was having routine blood work to check to see if he could come off some of his medications. He does not have any complaints at this time. No chest pain, nausea vomiting or diarrhea. Outpatient labs with a creatinine of 1.48 and a BUN of 44.   No past medical history on file.  There are no active problems to display for this patient.   No past surgical history on file.  Current Outpatient Rx  Name  Route  Sig  Dispense  Refill  . albuterol (PROVENTIL HFA;VENTOLIN HFA) 108 (90 BASE) MCG/ACT inhaler   Inhalation   Inhale 2 puffs into the lungs every 4 (four) hours as needed for wheezing or shortness of breath.         . allopurinol (ZYLOPRIM) 100 MG tablet   Oral   Take 100 mg by mouth daily.         Marland Kitchen aspirin 325 MG tablet   Oral   Take 650 mg by mouth daily as needed for moderate pain.         . FeAspGl-FeFum-B12-FA-C-Succ Ac (FERREX 28) TABS   Oral   Take 1 tablet by mouth daily.         . Fluticasone-Salmeterol (ADVAIR) 100-50 MCG/DOSE AEPB   Inhalation   Inhale 1 puff into the lungs 2 (two) times daily.         . furosemide (LASIX) 40 MG tablet   Oral   Take 40 mg by mouth daily.         Marland Kitchen glipiZIDE (GLUCOTROL) 5 MG tablet   Oral   Take 5 mg by mouth 2 (two) times daily before a meal.         . HYDROcodone-acetaminophen (NORCO/VICODIN) 5-325 MG per tablet   Oral   Take 1 tablet by mouth every 6 (six) hours as needed for moderate pain.         Marland Kitchen ibuprofen  (ADVIL,MOTRIN) 800 MG tablet   Oral   Take 800 mg by mouth 2 (two) times daily as needed for moderate pain.         Marland Kitchen lisinopril (PRINIVIL,ZESTRIL) 5 MG tablet   Oral   Take 5 mg by mouth daily.         Marland Kitchen lovastatin (MEVACOR) 20 MG tablet   Oral   Take 20 mg by mouth at bedtime.         . metFORMIN (GLUCOPHAGE) 500 MG tablet   Oral   Take 500 mg by mouth 2 (two) times daily with a meal.         . pioglitazone (ACTOS) 30 MG tablet   Oral   Take 30 mg by mouth daily.         . potassium chloride SA (K-DUR,KLOR-CON) 20 MEQ tablet   Oral   Take 20 mEq by mouth daily.           Allergies Review of patient's allergies indicates no known allergies.  No family history on file.  Social History History  Substance  Use Topics  . Smoking status: Never Smoker   . Smokeless tobacco: Not on file  . Alcohol Use: No    Review of Systems Constitutional: No fever/chills Eyes: No visual changes. ENT: No sore throat. Cardiovascular: Denies chest pain. Respiratory: Denies shortness of breath. Gastrointestinal: No abdominal pain.  No nausea, no vomiting.  No diarrhea.  No constipation. Genitourinary: Negative for dysuria. Musculoskeletal: Negative for back pain. Skin: Negative for rash. Neurological: Negative for headaches, focal weakness or numbness.  10-point ROS otherwise negative.  ____________________________________________   PHYSICAL EXAM:  VITAL SIGNS: ED Triage Vitals  Enc Vitals Group     BP 04/25/15 0953 126/56 mmHg     Pulse Rate 04/25/15 0953 81     Resp 04/25/15 0953 18     Temp 04/25/15 0953 97.8 F (36.6 C)     Temp Source 04/25/15 0953 Oral     SpO2 04/25/15 0953 100 %     Weight 04/25/15 0953 180 lb (81.647 kg)     Height 04/25/15 0953  (1.803 m)     Head Cir --      Peak Flow --      Pain Score --      Pain Loc --      Pain Edu? --      Excl. in GC? --     Constitutional: Alert and oriented. Well appearing and in no acute  distress. Eyes: Conjunctivae are normal. PERRL. EOMI. Head: Atraumatic. Nose: No congestion/rhinnorhea. Mouth/Throat: Mucous membranes are moist.  Oropharynx non-erythematous. Neck: No stridor.   Cardiovascular: Normal rate, regular rhythm. Grossly normal heart sounds.  Good peripheral circulation. Respiratory: Normal respiratory effort.  No retractions. Lungs CTAB. Gastrointestinal: Soft and nontender. No distention. No abdominal bruits. No CVA tenderness. Musculoskeletal: No lower extremity tenderness nor edema.  No joint effusions. Neurologic:  Normal speech and language. No gross focal neurologic deficits are appreciated. Speech is normal. No gait instability. Skin:  Skin is warm, dry and intact. No rash noted. Psychiatric: Mood and affect are normal. Speech and behavior are normal.  ____________________________________________   LABS (all labs ordered are listed, but only abnormal results are displayed)  Labs Reviewed  CBC WITH DIFFERENTIAL/PLATELET - Abnormal; Notable for the following:    RBC 2.39 (*)    Hemoglobin 8.2 (*)    HCT 25.5 (*)    MCV 106.7 (*)    MCH 34.3 (*)    RDW 16.1 (*)    Platelets 140 (*)    All other components within normal limits  BASIC METABOLIC PANEL - Abnormal; Notable for the following:    Potassium 5.6 (*)    Chloride 114 (*)    Glucose, Bld 228 (*)    BUN 47 (*)    Creatinine, Ser 1.49 (*)    GFR calc non Af Amer 41 (*)    GFR calc Af Amer 48 (*)    All other components within normal limits  BASIC METABOLIC PANEL - Abnormal; Notable for the following:    Potassium 5.2 (*)    Chloride 116 (*)    BUN 41 (*)    Creatinine, Ser 1.32 (*)    GFR calc non Af Amer 47 (*)    GFR calc Af Amer 55 (*)    All other components within normal limits   ____________________________________________  EKG  ED ECG REPORT I, Arelia Longest, the attending physician, personally viewed and interpreted this ECG.   Date: 04/25/2015  EKG Time: 1003  Rate: 78  Rhythm: normal sinus rhythm  Axis: Normal axis  Intervals:first-degree A-V block   ST&T Change: No ST elevations or depressions. No T-wave abnormalities. There is no T-wave peaking or sinus morphology or significant only widened QRS.  ____________________________________________  RADIOLOGY   ____________________________________________   PROCEDURES    ____________________________________________   INITIAL IMPRESSION / ASSESSMENT AND PLAN / ED COURSE  Pertinent labs & imaging results that were available during my care of the patient were reviewed by me and considered in my medical decision making (see chart for details).  ----------------------------------------- 3:41 PM on 04/25/2015 ----------------------------------------- Patient resting comfortably at this time. Continues to have no complaints. Potassium decreased to 5.2. Given Kayexalate. Says will be able to follow-up with primary care doctor in 1-2 days for a repeat of his blood work. Advised to drink plenty of water. Renal function also improved after fluids. No significant EKG changes for hyperkalemia. We'll discharge to home.  ____________________________________________   FINAL CLINICAL IMPRESSION(S) / ED DIAGNOSES   Acute hyperkalemia. Initial visit.   Myrna Blazeravid Matthew Symphonie Schneiderman, MD 04/25/15 [phone]

## 2015-08-18 ENCOUNTER — Inpatient Hospital Stay
Admission: EM | Admit: 2015-08-18 | Discharge: 2015-08-22 | DRG: 481 | Disposition: A | Discharge status: Rehabilitation Facility | Payer: Medicare Other | Attending: Internal Medicine | Admitting: Internal Medicine

## 2015-08-18 ENCOUNTER — Emergency Department: Payer: Medicare Other

## 2015-08-18 ENCOUNTER — Inpatient Hospital Stay (HOSPITAL_COMMUNITY)
Admit: 2015-08-18 | Discharge: 2015-08-18 | Disposition: A | Discharge status: Home / Self Care | Payer: Medicare Other | Attending: Internal Medicine | Admitting: Internal Medicine

## 2015-08-18 ENCOUNTER — Encounter: Payer: Self-pay | Admitting: Emergency Medicine

## 2015-08-18 DIAGNOSIS — E785 Hyperlipidemia, unspecified: Secondary | ICD-10-CM | POA: Diagnosis present

## 2015-08-18 DIAGNOSIS — S72002A Fracture of unspecified part of neck of left femur, initial encounter for closed fracture: Secondary | ICD-10-CM | POA: Diagnosis present

## 2015-08-18 DIAGNOSIS — I129 Hypertensive chronic kidney disease with stage 1 through stage 4 chronic kidney disease, or unspecified chronic kidney disease: Secondary | ICD-10-CM | POA: Diagnosis present

## 2015-08-18 DIAGNOSIS — D649 Anemia, unspecified: Secondary | ICD-10-CM | POA: Diagnosis present

## 2015-08-18 DIAGNOSIS — W19XXXA Unspecified fall, initial encounter: Secondary | ICD-10-CM | POA: Diagnosis present

## 2015-08-18 DIAGNOSIS — I252 Old myocardial infarction: Secondary | ICD-10-CM

## 2015-08-18 DIAGNOSIS — Z89019 Acquired absence of unspecified thumb: Secondary | ICD-10-CM | POA: Diagnosis not present

## 2015-08-18 DIAGNOSIS — R296 Repeated falls: Secondary | ICD-10-CM | POA: Diagnosis present

## 2015-08-18 DIAGNOSIS — N189 Chronic kidney disease, unspecified: Secondary | ICD-10-CM | POA: Diagnosis present

## 2015-08-18 DIAGNOSIS — Z8249 Family history of ischemic heart disease and other diseases of the circulatory system: Secondary | ICD-10-CM | POA: Diagnosis not present

## 2015-08-18 DIAGNOSIS — D62 Acute posthemorrhagic anemia: Secondary | ICD-10-CM | POA: Diagnosis not present

## 2015-08-18 DIAGNOSIS — R55 Syncope and collapse: Secondary | ICD-10-CM

## 2015-08-18 DIAGNOSIS — E1122 Type 2 diabetes mellitus with diabetic chronic kidney disease: Secondary | ICD-10-CM | POA: Diagnosis present

## 2015-08-18 DIAGNOSIS — E860 Dehydration: Secondary | ICD-10-CM | POA: Diagnosis present

## 2015-08-18 DIAGNOSIS — Z7982 Long term (current) use of aspirin: Secondary | ICD-10-CM

## 2015-08-18 DIAGNOSIS — S72009A Fracture of unspecified part of neck of unspecified femur, initial encounter for closed fracture: Secondary | ICD-10-CM

## 2015-08-18 DIAGNOSIS — Z9181 History of falling: Secondary | ICD-10-CM | POA: Diagnosis not present

## 2015-08-18 DIAGNOSIS — Z01818 Encounter for other preprocedural examination: Secondary | ICD-10-CM | POA: Diagnosis not present

## 2015-08-18 DIAGNOSIS — Z79899 Other long term (current) drug therapy: Secondary | ICD-10-CM

## 2015-08-18 HISTORY — DX: Hyperlipidemia, unspecified: E78.5

## 2015-08-18 HISTORY — DX: Essential (primary) hypertension: I10

## 2015-08-18 HISTORY — DX: Chronic kidney disease, unspecified: N18.9

## 2015-08-18 HISTORY — DX: Old myocardial infarction: I25.2

## 2015-08-18 HISTORY — DX: Anemia, unspecified: D64.9

## 2015-08-18 HISTORY — DX: Type 2 diabetes mellitus without complications: E11.9

## 2015-08-18 LAB — CBC WITH DIFFERENTIAL/PLATELET
BASOS PCT: 1 %
Basophils Absolute: 0.1 10*3/uL (ref 0–0.1)
EOS PCT: 0 %
Eosinophils Absolute: 0 10*3/uL (ref 0–0.7)
HCT: 30.9 % — ABNORMAL LOW (ref 40.0–52.0)
Hemoglobin: 10.2 g/dL — ABNORMAL LOW (ref 13.0–18.0)
Lymphocytes Relative: 8 %
Lymphs Abs: 0.7 10*3/uL — ABNORMAL LOW (ref 1.0–3.6)
MCH: 34.2 pg — ABNORMAL HIGH (ref 26.0–34.0)
MCHC: 33 g/dL (ref 32.0–36.0)
MCV: 103.6 fL — AB (ref 80.0–100.0)
MONO ABS: 0.6 10*3/uL (ref 0.2–1.0)
MONOS PCT: 7 %
Neutro Abs: 7.7 10*3/uL — ABNORMAL HIGH (ref 1.4–6.5)
Neutrophils Relative %: 84 %
PLATELETS: 163 10*3/uL (ref 150–440)
RBC: 2.98 MIL/uL — ABNORMAL LOW (ref 4.40–5.90)
RDW: 15.2 % — AB (ref 11.5–14.5)
WBC: 9.2 10*3/uL (ref 3.8–10.6)

## 2015-08-18 LAB — URINALYSIS COMPLETE WITH MICROSCOPIC (ARMC ONLY)
BACTERIA UA: NONE SEEN
BILIRUBIN URINE: NEGATIVE
Glucose, UA: NEGATIVE mg/dL
LEUKOCYTES UA: NEGATIVE
Nitrite: NEGATIVE
PH: 5 (ref 5.0–8.0)
PROTEIN: NEGATIVE mg/dL
SQUAMOUS EPITHELIAL / LPF: NONE SEEN
Specific Gravity, Urine: 1.016 (ref 1.005–1.030)

## 2015-08-18 LAB — COMPREHENSIVE METABOLIC PANEL
ALBUMIN: 3.3 g/dL — AB (ref 3.5–5.0)
ALK PHOS: 165 U/L — AB (ref 38–126)
ALT: 22 U/L (ref 17–63)
AST: 36 U/L (ref 15–41)
Anion gap: 8 (ref 5–15)
BILIRUBIN TOTAL: 1.2 mg/dL (ref 0.3–1.2)
BUN: 22 mg/dL — AB (ref 6–20)
CALCIUM: 8.6 mg/dL — AB (ref 8.9–10.3)
CO2: 22 mmol/L (ref 22–32)
Chloride: 110 mmol/L (ref 101–111)
Creatinine, Ser: 1.17 mg/dL (ref 0.61–1.24)
GFR calc Af Amer: >60 mL/min (ref >60)
GFR calc non Af Amer: 55 mL/min — ABNORMAL LOW (ref >60)
GLUCOSE: 121 mg/dL — AB (ref 65–99)
Potassium: 4.5 mmol/L (ref 3.5–5.1)
Sodium: 140 mmol/L (ref 135–145)
Total Protein: 6.2 g/dL — ABNORMAL LOW (ref 6.5–8.1)

## 2015-08-18 LAB — APTT: aPTT: 35 seconds (ref 24–36)

## 2015-08-18 LAB — MRSA PCR SCREENING: MRSA by PCR: POSITIVE — AB

## 2015-08-18 LAB — PROTIME-INR
INR: 1.07
Prothrombin Time: 14.1 seconds (ref 11.4–15.0)

## 2015-08-18 LAB — GLUCOSE, CAPILLARY
Glucose-Capillary: 114 mg/dL — ABNORMAL HIGH (ref 65–99)
Glucose-Capillary: 100 mg/dL — ABNORMAL HIGH (ref 65–99)
Glucose-Capillary: 128 mg/dL — ABNORMAL HIGH (ref 65–99)

## 2015-08-18 MED ORDER — ACETAMINOPHEN 325 MG PO TABS: 650.0000 mg | ORAL_TABLET | Freq: Four times a day (QID) | ORAL | Status: DC | PRN

## 2015-08-18 MED ORDER — MORPHINE SULFATE (PF) 4 MG/ML IV SOLN
4.0000 mg | Freq: Once | INTRAVENOUS | Status: AC
  Administered 2015-08-18: 4 mg via INTRAVENOUS

## 2015-08-18 MED ORDER — MORPHINE SULFATE (PF) 2 MG/ML IV SOLN
2.0000 mg | INTRAVENOUS | Status: DC | PRN
  Administered 2015-08-19 (×3): 2 mg via INTRAVENOUS
  Filled 2015-08-18 (×3): qty 1

## 2015-08-18 MED ORDER — CEFAZOLIN SODIUM-DEXTROSE 2-3 GM-% IV SOLR
2.0000 g | INTRAVENOUS | Status: DC
  Filled 2015-08-18: qty 50

## 2015-08-18 MED ORDER — METHOCARBAMOL 1000 MG/10ML IJ SOLN: 500.0000 mg | Freq: Four times a day (QID) | INTRAVENOUS | Status: DC | PRN

## 2015-08-18 MED ORDER — HYDROCODONE-ACETAMINOPHEN 5-325 MG PO TABS
1.0000 | ORAL_TABLET | ORAL | Status: DC | PRN
  Administered 2015-08-18: 1 via ORAL
  Filled 2015-08-18: qty 1

## 2015-08-18 MED ORDER — ALLOPURINOL 100 MG PO TABS: 100.0000 mg | ORAL_TABLET | Freq: Every day | ORAL | Status: DC

## 2015-08-18 MED ORDER — CLINDAMYCIN PHOSPHATE 900 MG/50ML IV SOLN
900.0000 mg | INTRAVENOUS | Status: DC
  Filled 2015-08-18: qty 50

## 2015-08-18 MED ORDER — ONDANSETRON HCL 4 MG/2ML IJ SOLN
4.0000 mg | Freq: Once | INTRAMUSCULAR | Status: AC
  Administered 2015-08-18: 4 mg via INTRAVENOUS
  Filled 2015-08-18: qty 2

## 2015-08-18 MED ORDER — SENNA 8.6 MG PO TABS
1.0000 | ORAL_TABLET | Freq: Two times a day (BID) | ORAL | Status: DC
  Administered 2015-08-18 (×2): 8.6 mg via ORAL
  Filled 2015-08-18 (×2): qty 1

## 2015-08-18 MED ORDER — LACTATED RINGERS IV SOLN: INTRAVENOUS | Status: DC

## 2015-08-18 MED ORDER — MORPHINE SULFATE (PF) 2 MG/ML IV SOLN
2.0000 mg | Freq: Once | INTRAVENOUS | Status: AC
  Administered 2015-08-18: 2 mg via INTRAVENOUS
  Filled 2015-08-18: qty 1

## 2015-08-18 MED ORDER — ALBUTEROL SULFATE (2.5 MG/3ML) 0.083% IN NEBU
2.5000 mg | INHALATION_SOLUTION | RESPIRATORY_TRACT | Status: DC | PRN
Start: 2015-08-18 — End: 2015-08-19

## 2015-08-18 MED ORDER — METHOCARBAMOL 500 MG PO TABS: 500.0000 mg | ORAL_TABLET | Freq: Four times a day (QID) | ORAL | Status: DC | PRN

## 2015-08-18 MED ORDER — INSULIN ASPART 100 UNIT/ML ~~LOC~~ SOLN: 0.0000 [IU] | Freq: Three times a day (TID) | SUBCUTANEOUS | Status: DC

## 2015-08-18 MED ORDER — MUPIROCIN 2 % EX OINT
1.0000 "application " | TOPICAL_OINTMENT | Freq: Two times a day (BID) | CUTANEOUS | Status: DC
  Administered 2015-08-19: 1 via NASAL
  Filled 2015-08-18 (×2): qty 22

## 2015-08-18 MED ORDER — MORPHINE SULFATE (PF) 2 MG/ML IV SOLN: 0.5000 mg | INTRAVENOUS | Status: DC | PRN

## 2015-08-18 MED ORDER — ALBUTEROL SULFATE HFA 108 (90 BASE) MCG/ACT IN AERS: 2.0000 | INHALATION_SPRAY | RESPIRATORY_TRACT | Status: DC | PRN

## 2015-08-18 MED ORDER — ONDANSETRON HCL 4 MG PO TABS: 4.0000 mg | ORAL_TABLET | Freq: Four times a day (QID) | ORAL | Status: DC | PRN

## 2015-08-18 MED ORDER — CHLORHEXIDINE GLUCONATE CLOTH 2 % EX PADS
6.0000 | MEDICATED_PAD | Freq: Every day | CUTANEOUS | Status: DC
  Administered 2015-08-19: 6 via TOPICAL

## 2015-08-18 MED ORDER — ACETAMINOPHEN 650 MG RE SUPP: 650.0000 mg | Freq: Four times a day (QID) | RECTAL | Status: DC | PRN

## 2015-08-18 MED ORDER — SODIUM CHLORIDE 0.9 % IV SOLN
INTRAVENOUS | Status: DC
  Administered 2015-08-18 – 2015-08-19 (×2): via INTRAVENOUS

## 2015-08-18 MED ORDER — PRAVASTATIN SODIUM 20 MG PO TABS
10.0000 mg | ORAL_TABLET | Freq: Every day | ORAL | Status: DC
  Administered 2015-08-18: 10 mg via ORAL
  Filled 2015-08-18: qty 1

## 2015-08-18 MED ORDER — SODIUM CHLORIDE 0.9 % IV SOLN: INTRAVENOUS | Status: DC

## 2015-08-18 MED ORDER — MORPHINE SULFATE (PF) 4 MG/ML IV SOLN
INTRAVENOUS | Status: AC
  Administered 2015-08-18: 4 mg via INTRAVENOUS
  Filled 2015-08-18: qty 1

## 2015-08-18 MED ORDER — ONDANSETRON HCL 4 MG/2ML IJ SOLN: 4.0000 mg | Freq: Four times a day (QID) | INTRAMUSCULAR | Status: DC | PRN

## 2015-08-18 MED ORDER — ZOLPIDEM TARTRATE 5 MG PO TABS: 5.0000 mg | ORAL_TABLET | Freq: Every evening | ORAL | Status: DC | PRN

## 2015-08-18 MED ORDER — HYDROCODONE-ACETAMINOPHEN 5-325 MG PO TABS: 1.0000 | ORAL_TABLET | Freq: Four times a day (QID) | ORAL | Status: DC | PRN

## 2015-08-18 MED ORDER — BUPROPION HCL 100 MG PO TABS
100.0000 mg | ORAL_TABLET | Freq: Two times a day (BID) | ORAL | Status: DC
  Administered 2015-08-18: 100 mg via ORAL
  Filled 2015-08-18 (×3): qty 1

## 2015-08-18 NOTE — Plan of Care (Signed)
Problem: Consults Goal: Hip/Femur Fracture Patient Education See Patient Education Module for education specifics. Outcome: Not Met (add Reason) confused

## 2015-08-18 NOTE — ED Notes (Signed)
Pt via ems from home; fell on Wednesday. He was put into bed, where he has been ever since. He c/o pain to his left hip/groin area that appears to come in sharp pains every few minutes. Pt is alert & oriented with warm, dry skin.

## 2015-08-18 NOTE — Consult Note (Signed)
ORTHOPAEDIC CONSULTATION  REQUESTING PHYSICIAN: Auburn Bilberry, MD  Chief Complaint: Left hip pain  HPI: Evan Fernandez is a 81 y.o. male who complains of  left hip pain following a fall 2 days ago on his porch. The family got him back to bed but he has been unable to get up since the injury. He was brought to the emergency room today where exam and x-rays reveal a minimally displaced base neck fracture of the left hip. He is being admitted by the hospitalist service for medical evaluation. I have spoken to the patient and his son and explained the condition to them. They are agreeable to the recommendation of surgery to correct this and facilitate healing and nursing care. He is dehydrated so we will plan on surgery tomorrow morning after rehydration overnight and clearance by the medical service. The risks and benefits of surgery versus is nonoperative treatment were discussed. Possible complications were discussed in detail.  Past Medical History  Diagnosis Date  . Hypertension   . DM (diabetes mellitus)   . Hyperlipemia   . CKD (chronic kidney disease)   . Anemia   . MI, old    Past Surgical History  Procedure Laterality Date  . Thumb amputation     Social History   Social History  . Marital Status: Married    Spouse Name: N/A  . Number of Children: N/A  . Years of Education: N/A   Social History Main Topics  . Smoking status: Never Smoker   . Smokeless tobacco: None  . Alcohol Use: No  . Drug Use: No  . Sexual Activity: Not Asked   Other Topics Concern  . None   Social History Narrative   Family History  Problem Relation Age of Onset  . Hypertension     No Known Allergies Prior to Admission medications   Medication Sig Start Date End Date Taking? Authorizing Provider  allopurinol (ZYLOPRIM) 100 MG tablet Take 100 mg by mouth daily.   Yes Historical Provider, MD  aspirin 325 MG tablet Take 650 mg by mouth daily as needed for moderate pain.   Yes Historical  Provider, MD  buPROPion (WELLBUTRIN) 100 MG tablet Take 100 mg by mouth 2 (two) times daily.   Yes Historical Provider, MD  FeAspGl-FeFum-B12-FA-C-Succ Ac (FERREX 28) TABS Take 1 tablet by mouth daily.   Yes Historical Provider, MD  glipiZIDE (GLUCOTROL) 5 MG tablet Take 5 mg by mouth 2 (two) times daily before a meal.   Yes Historical Provider, MD  ibuprofen (ADVIL,MOTRIN) 800 MG tablet Take 800 mg by mouth 2 (two) times daily as needed for moderate pain.   Yes Historical Provider, MD  lovastatin (MEVACOR) 20 MG tablet Take 20 mg by mouth at bedtime.   Yes Historical Provider, MD  metFORMIN (GLUCOPHAGE) 500 MG tablet Take 500 mg by mouth 2 (two) times daily with a meal.   Yes Historical Provider, MD  traMADol (ULTRAM) 50 MG tablet Take 50 mg by mouth every 6 (six) hours as needed.   Yes Historical Provider, MD  albuterol (PROVENTIL HFA;VENTOLIN HFA) 108 (90 BASE) MCG/ACT inhaler Inhale 2 puffs into the lungs every 4 (four) hours as needed for wheezing or shortness of breath.    Historical Provider, MD   Dg Chest Portable 1 View  08/18/2015   CLINICAL DATA:  Preop chest x-ray for left hip surgery.  EXAM: PORTABLE CHEST - 1 VIEW  COMPARISON:  Two-view chest x-ray 04/14/2014.  FINDINGS: The heart is enlarged. Bilateral pleural  effusions are noted. Bibasilar airspace disease likely reflects atelectasis. Atherosclerotic changes are noted at the aortic arch. There is no other significant edema. The upper lung fields are clear. The lung volumes are low.  IMPRESSION: 1. Small bilateral pleural effusions and associated atelectasis. 2. Mild cardiomegaly is stable. 3. Atherosclerosis. 4. Low lung volumes.   Electronically Signed   By: Marin Roberts M.D.   On: 08/18/2015 12:17   Dg Hip Unilat With Pelvis 2-3 Views Left  08/18/2015   CLINICAL DATA:  Larey Seat on Wednesday, in bed since, LEFT hip and groin pain  EXAM: DG HIP (WITH OR WITHOUT PELVIS) 2-3V LEFT  COMPARISON:  None  FINDINGS: Osseous  demineralization.  Symmetric hip and SI joints.  Impacted basicervical proximal LEFT femoral fracture.  Question of nondisplaced lesser trochanteric fracture is raised.  No additional femoral fracture or dislocation seen.  Pelvis appears intact.  Degenerative disc disease changes inferior lumbar spine.  IMPRESSION: Impacted basicervical LEFT femoral neck fracture with question nondisplaced lesser trochanteric fracture.   Electronically Signed   By: Ulyses Southward M.D.   On: 08/18/2015 11:29    Positive ROS: All other systems have been reviewed and were otherwise negative with the exception of those mentioned in the HPI and as above.  Physical Exam: General: Alert, no acute distress Cardiovascular: No pedal edema Respiratory: No cyanosis, no use of accessory musculature GI: No organomegaly, abdomen is soft and non-tender Skin: No lesions in the area of chief complaint Neurologic: Sensation intact distally Psychiatric: Patient is competent for consent with normal mood and affect Lymphatic: No axillary or cervical lymphadenopathy  MUSCULOSKELETAL: The patient is awake and alert. He appears somewhat emaciated. Skin and tongue are dry. Left leg is shortened and x-ray rotated. There is pain with range of motion. Skin is intact. Neurovascular status is normal distally. The right leg and upper extremities are unremarkable. Chest and spine are unremarkable.  Assessment: Base next fracture left hip  Plan: Open reduction internal fixation left hip tomorrow.    Valinda Hoar, MD [phone]   08/18/2015 1:23 PM

## 2015-08-18 NOTE — ED Provider Notes (Signed)
[name] Emergency Department Provider Note  ____________________________________________  Time seen: Approximately 11:37 AM  I have reviewed the triage vital signs and the nursing notes.   HISTORY  Chief Complaint Fall    HPI Evan Fernandez is a 81 y.o. male who reports he fell on Wednesday. A lot of pain in his left hip and so hasn't walked since then. Patient reports a history of frequent falls his doctor thought maybe he was having mini strokes as well he was falling. Although he Wednesdays fall KG read and interpreted by me shows normal sinus rhythm at a rate of 91 left axis left anterior hemiblock RSR prime consistent with possible incomplete right bundle no acute changes was the first fall in a month. Patient's past medical history significant for diabetes. Patient's medicines include albuterol which makes me suspect he also has COPD.   History reviewed. No pertinent past medical history. Diabetes. Frequent falls. There are no active problems to display for this patient.   History reviewed. No pertinent past surgical history.  Current Outpatient Rx  Name  Route  Sig  Dispense  Refill  . allopurinol (ZYLOPRIM) 100 MG tablet   Oral   Take 100 mg by mouth daily.         Marland Kitchen buPROPion (WELLBUTRIN) 100 MG tablet   Oral   Take 100 mg by mouth 2 (two) times daily.         . FeAspGl-FeFum-B12-FA-C-Succ Ac (FERREX 28) TABS   Oral   Take 1 tablet by mouth daily.         Marland Kitchen glipiZIDE (GLUCOTROL) 5 MG tablet   Oral   Take 5 mg by mouth 2 (two) times daily before a meal.         . ibuprofen (ADVIL,MOTRIN) 800 MG tablet   Oral   Take 800 mg by mouth 2 (two) times daily as needed for moderate pain.         Marland Kitchen lovastatin (MEVACOR) 20 MG tablet   Oral   Take 20 mg by mouth at bedtime.         . metFORMIN (GLUCOPHAGE) 500 MG tablet   Oral   Take 500 mg by mouth 2 (two) times daily with a meal.         . traMADol (ULTRAM) 50 MG  tablet   Oral   Take 50 mg by mouth every 6 (six) hours as needed.         Marland Kitchen albuterol (PROVENTIL HFA;VENTOLIN HFA) 108 (90 BASE) MCG/ACT inhaler   Inhalation   Inhale 2 puffs into the lungs every 4 (four) hours as needed for wheezing or shortness of breath.         Marland Kitchen aspirin 325 MG tablet   Oral   Take 650 mg by mouth daily as needed for moderate pain.           Allergies Review of patient's allergies indicates no known allergies.  No family history on file.  Social History Social History  Substance Use Topics  . Smoking status: Former Games developer  . Smokeless tobacco: None  . Alcohol Use: No    Review of Systems Constitutional: No fever/chills Eyes: No visual changes. ENT: No sore throat. Cardiovascular: Denies chest pain. Respiratory: Denies shortness of breath. Gastrointestinal: No abdominal pain.  No nausea, no vomiting.  No diarrhea.  No constipation. Genitourinary: Negative for dysuria. Musculoskeletal: Negative for back pain. Skin: Negative for rash. Neurological: Negative for headaches, focal weakness or numbness.  10-point ROS otherwise negative.  ____________________________________________   PHYSICAL EXAM:  VITAL SIGNS: ED Triage Vitals  Enc Vitals Group     BP 08/18/15 1022 158/76 mmHg     Pulse Rate 08/18/15 1022 95     Resp 08/18/15 1022 18     Temp 08/18/15 1022 98.3 F (36.8 C)     Temp Source 08/18/15 1022 Oral     SpO2 08/18/15 1022 95 %     Weight 08/18/15 1022 176 lb (79.833 kg)     Height 08/18/15 1022 6' (1.829 m)     Head Cir --      Peak Flow --      Pain Score 08/18/15 1023 8     Pain Loc --      Pain Edu? --      Excl. in GC? --     Constitutional: Alert and oriented. Well appearing and in no acute distress. Eyes: Conjunctivae are normal. PERRL. EOMI. Head: Atraumatic. Nose: No congestion/rhinnorhea. Mouth/Throat: Mucous membranes are moist.  Oropharynx non-erythematous. Neck: No stridor. Cardiovascular: Normal rate,  regular rhythm. Grossly normal heart sounds.  Good peripheral circulation. Respiratory: Normal respiratory effort.  No retractions. Lungs CTAB. Gastrointestinal: Soft and nontender. No distention. No abdominal bruits. No CVA tenderness. Musculoskeletal: No lower extremity tenderness nor edema. Except for left hip No joint effusions. Neurologic:  Normal speech and language. No gross focal neurologic deficits are appreciated. No gait instability. Skin:  Skin is warm, dry and intact. No rash noted. Psychiatric: Mood and affect are normal. Speech and behavior are normal.  ____________________________________________   LABS (all labs ordered are listed, but only abnormal results are displayed)  Labs Reviewed  COMPREHENSIVE METABOLIC PANEL  CBC WITH DIFFERENTIAL/PLATELET  URINALYSIS COMPLETEWITH MICROSCOPIC ([facility] ONLY)  PROTIME-INR   ____________________________________________  EKG  EKG read and interpreted by me shows normal sinus rhythm at a rate of 91 left axis T in V3 that was not there previously although that could be due to lead placement perhaps. Patient has left anterior hemiblock RSR prime in lead V1 suggests possibility of incomplete right bundle-branch block with QRS duration is normal ____________________________________________  RADIOLOGY  Distal femoral neck fracture read and interpreted by me ____________________________________________   PROCEDURES   ____________________________________________   INITIAL IMPRESSION / ASSESSMENT AND PLAN / ED COURSE  Pertinent labs & imaging results that were available during my care of the patient were reviewed by me and considered in my medical decision making (see chart for details).  Discussed with Dr. Allena Katz the hospitalist and Dr. Hyacinth Meeker orthopedic doctor we will admit the patient patient will probably be hydrated today and done in the OR tomorrow. ____________________________________________   FINAL CLINICAL  IMPRESSION(S) / ED DIAGNOSES  Final diagnoses:  Fracture, hip, left, closed, initial encounter      Arnaldo Natal, MD 08/18/15 1145

## 2015-08-18 NOTE — Progress Notes (Signed)
*  PRELIMINARY RESULTS* Echocardiogram 2D Echocardiogram has been performed.  Evan Fernandez 08/18/2015, 7:33 PM

## 2015-08-18 NOTE — ED Notes (Signed)
Pt fell on Wednesday, pt complains of left groin pain, pt appears weak and is pale, pt is alert and oriented, family at bedside

## 2015-08-18 NOTE — H&P (Signed)
[facility] - [place] at [facility]   PATIENT NAME: Evan Fernandez    MR#:  [id number]  DATE OF BIRTH:  [birth year]  DATE OF ADMISSION:  08/18/2015  PRIMARY CARE PHYSICIAN: Derwood Kaplan, MD   REQUESTING/REFERRING PHYSICIAN: Dr. Marge Duncans  CHIEF COMPLAINT:   Chief Complaint  Patient presents with  . Fall    HISTORY OF PRESENT ILLNESS: Evan Fernandez  is a 81 y.o. male with a known history of  hypertension, diabetes type 2, hyperlipidemia, chronic kidney disease, anemia and history of old MI according to the patient who apparently has been having episodes of falling. For the past few weeks. Patient has episodes where he kind of just falls he was seen by primary care provider and was told that he might be having TIAs. Patient earlier today went to his mailbox and again had this episode where he can fell. This time he started having pain in the hip and was noted to have hip fracture. Her asked to admit the patient. He denies any chest pain shortness of breath no palpitations no dizziness. Denies any nausea vomiting or diarrhea PAST MEDICAL HISTORY:   Past Medical History  Diagnosis Date  . Hypertension   . DM (diabetes mellitus)   . Hyperlipemia   . CKD (chronic kidney disease)   . Anemia   . MI, old     PAST SURGICAL HISTORY:  Past Surgical History  Procedure Laterality Date  . Thumb amputation      SOCIAL HISTORY:  Social History  Substance Use Topics  . Smoking status: Never Smoker   . Smokeless tobacco: Not on file  . Alcohol Use: No    FAMILY HISTORY:  Family History  Problem Relation Age of Onset  . Hypertension      DRUG ALLERGIES: No Known Allergies  REVIEW OF SYSTEMS:   CONSTITUTIONAL: No fever, fatigue or weakness.  EYES: No blurred or double vision.  EARS, NOSE, AND THROAT: No tinnitus or ear pain.  RESPIRATORY: No cough, shortness of breath, wheezing or hemoptysis.  CARDIOVASCULAR: No chest pain, orthopnea, edema.   GASTROINTESTINAL: No nausea, vomiting, diarrhea or abdominal pain.  GENITOURINARY: No dysuria, hematuria.  ENDOCRINE: No polyuria, nocturia,  HEMATOLOGY: No anemia, easy bruising or bleeding SKIN: No rash or lesion. MUSCULOSKELETAL: Complains of hip pain left  NEUROLOGIC: No tingling, numbness, weakness.  PSYCHIATRY: No anxiety or depression.   MEDICATIONS AT HOME:  Prior to Admission medications   Medication Sig Start Date End Date Taking? Authorizing Provider  allopurinol (ZYLOPRIM) 100 MG tablet Take 100 mg by mouth daily.   Yes Historical Provider, MD  aspirin 325 MG tablet Take 650 mg by mouth daily as needed for moderate pain.   Yes Historical Provider, MD  buPROPion (WELLBUTRIN) 100 MG tablet Take 100 mg by mouth 2 (two) times daily.   Yes Historical Provider, MD  FeAspGl-FeFum-B12-FA-C-Succ Ac (FERREX 28) TABS Take 1 tablet by mouth daily.   Yes Historical Provider, MD  glipiZIDE (GLUCOTROL) 5 MG tablet Take 5 mg by mouth 2 (two) times daily before a meal.   Yes Historical Provider, MD  ibuprofen (ADVIL,MOTRIN) 800 MG tablet Take 800 mg by mouth 2 (two) times daily as needed for moderate pain.   Yes Historical Provider, MD  lovastatin (MEVACOR) 20 MG tablet Take 20 mg by mouth at bedtime.   Yes Historical Provider, MD  metFORMIN (GLUCOPHAGE) 500 MG tablet Take 500 mg by mouth 2 (two) times daily with a meal.  Yes Historical Provider, MD  traMADol (ULTRAM) 50 MG tablet Take 50 mg by mouth every 6 (six) hours as needed.   Yes Historical Provider, MD  albuterol (PROVENTIL HFA;VENTOLIN HFA) 108 (90 BASE) MCG/ACT inhaler Inhale 2 puffs into the lungs every 4 (four) hours as needed for wheezing or shortness of breath.    Historical Provider, MD      PHYSICAL EXAMINATION:   VITAL SIGNS: Blood pressure 138/72, pulse 96, temperature 98.3 F (36.8 C), temperature source Oral, resp. rate 22, height 6' (1.829 m), weight 79.833 kg (176 lb), SpO2 94 %.  GENERAL:  81 y.o.-year-old patient  lying in the bed with no acute distress.  EYES: Pupils equal, round, reactive to light and accommodation. No scleral icterus. Extraocular muscles intact.  HEENT: Head atraumatic, normocephalic. Oropharynx and nasopharynx clear.  NECK:  Supple, no jugular venous distention. No thyroid enlargement, no tenderness.  LUNGS: Normal breath sounds bilaterally, no wheezing, rales,rhonchi or crepitation. No use of accessory muscles of respiration.  CARDIOVASCULAR: S1, S2 normal. No murmurs, rubs, or gallops.  ABDOMEN: Soft, nontender, nondistended. Bowel sounds present. No organomegaly or mass.  EXTREMITIES: No pedal edema, cyanosis, or clubbing.  NEUROLOGIC: Cranial nerves II through XII are intact. Muscle strength 5/5 in all extremities. Sensation intact. Gait not checked.  PSYCHIATRIC: The patient is alert and oriented x 3.  SKIN: No obvious rash, lesion, or ulcer.   LABORATORY PANEL:   CBC No results for input(s): WBC, HGB, HCT, PLT, MCV, MCH, MCHC, RDW, LYMPHSABS, MONOABS, EOSABS, BASOSABS, BANDABS in the last 168 hours.  Invalid input(s): NEUTRABS, BANDSABD ------------------------------------------------------------------------------------------------------------------  Chemistries  No results for input(s): NA, K, CL, CO2, GLUCOSE, BUN, CREATININE, CALCIUM, MG, AST, ALT, ALKPHOS, BILITOT in the last 168 hours.  Invalid input(s): GFRCGP ------------------------------------------------------------------------------------------------------------------ CrCl cannot be calculated (Patient has no serum creatinine result on file.). ------------------------------------------------------------------------------------------------------------------ No results for input(s): TSH, T4TOTAL, T3FREE, THYROIDAB in the last 72 hours.  Invalid input(s): FREET3   Coagulation profile No results for input(s): INR, PROTIME in the last 168  hours. ------------------------------------------------------------------------------------------------------------------- No results for input(s): DDIMER in the last 72 hours. -------------------------------------------------------------------------------------------------------------------  Cardiac Enzymes No results for input(s): CKMB, TROPONINI, MYOGLOBIN in the last 168 hours.  Invalid input(s): CK ------------------------------------------------------------------------------------------------------------------ Invalid input(s): POCBNP  ---------------------------------------------------------------------------------------------------------------  Urinalysis    Component Value Date/Time   COLORURINE Yellow 04/12/2014 0910   APPEARANCEUR Clear 04/12/2014 0910   LABSPEC 1.017 04/12/2014 0910   PHURINE 5.0 04/12/2014 0910   GLUCOSEU Negative 04/12/2014 0910   HGBUR 3+ 04/12/2014 0910   BILIRUBINUR Negative 04/12/2014 0910   KETONESUR Trace 04/12/2014 0910   PROTEINUR Negative 04/12/2014 0910   NITRITE Negative 04/12/2014 0910   LEUKOCYTESUR Negative 04/12/2014 0910     RADIOLOGY: Dg Hip Unilat With Pelvis 2-3 Views Left  08/18/2015   CLINICAL DATA:  Larey Seat on Wednesday, in bed since, LEFT hip and groin pain  EXAM: DG HIP (WITH OR WITHOUT PELVIS) 2-3V LEFT  COMPARISON:  None  FINDINGS: Osseous demineralization.  Symmetric hip and SI joints.  Impacted basicervical proximal LEFT femoral fracture.  Question of nondisplaced lesser trochanteric fracture is raised.  No additional femoral fracture or dislocation seen.  Pelvis appears intact.  Degenerative disc disease changes inferior lumbar spine.  IMPRESSION: Impacted basicervical LEFT femoral neck fracture with question nondisplaced lesser trochanteric fracture.   Electronically Signed   By: Ulyses Southward M.D.   On: 08/18/2015 11:29    EKG: Orders placed or performed during the hospital encounter of 08/18/15  . EKG 12-Lead  .  EKG  12-Lead  . ED EKG  . ED EKG    IMPRESSION AND PLAN: Patient is a 81 year old white male status post fall with hip fracture  1. Hip fracture patient at moderate risk of complications but no current cardiopulmonary symptoms. At this time may proceed to the OR plan for him to go to the OR tomorrow we'll keep him nothing by mouth after midnight  2. Recurrent falls unclear if patient is having syncope at this point I'll place him on telemetry monitor as well as echocardiogram of the heart echo result does not need to be available prior to the OR  3. Diabetes type 2 I'll hold his oral medications place him on sliding scale insulin due to planned surgery  4. Hyperlipidemia continue him lovastatin  5. Miscellaneous recommend Lovenox or heparin for DVT prophylaxis postop    All the records are reviewed and case discussed with ED provider. Management plans discussed with the patient, family and they are in agreement.  CODE STATUS: Full    TOTAL TIME TAKING CARE OF THIS PATIENT: 55 minutes.    Auburn Bilberry M.D on 08/18/2015 at 11:58 AM  Between 7am to 6pm - Pager - [phone]  After 6pm go to [email or web address] - password [email or web address] [facility]  [facility]  Office  [phone]  CC: Primary care physician; Derwood Kaplan, MD

## 2015-08-19 ENCOUNTER — Encounter: Payer: Self-pay | Admitting: Anesthesiology

## 2015-08-19 ENCOUNTER — Inpatient Hospital Stay: Payer: Medicare Other

## 2015-08-19 ENCOUNTER — Inpatient Hospital Stay: Payer: Medicare Other | Admitting: Anesthesiology

## 2015-08-19 ENCOUNTER — Encounter
Admission: EM | Disposition: A | Discharge status: Rehabilitation Facility | Payer: Self-pay | Source: Home / Self Care | Attending: Internal Medicine

## 2015-08-19 HISTORY — PX: COMPRESSION HIP SCREW: SHX1386

## 2015-08-19 LAB — GLUCOSE, CAPILLARY
GLUCOSE-CAPILLARY: 124 mg/dL — AB (ref 65–99)
GLUCOSE-CAPILLARY: 115 mg/dL — AB (ref 65–99)
GLUCOSE-CAPILLARY: 115 mg/dL — AB (ref 65–99)
GLUCOSE-CAPILLARY: 132 mg/dL — AB (ref 65–99)
Glucose-Capillary: 132 mg/dL — ABNORMAL HIGH (ref 65–99)

## 2015-08-19 LAB — CBC
HCT: 25.3 % — ABNORMAL LOW (ref 40.0–52.0)
HEMOGLOBIN: 8.6 g/dL — AB (ref 13.0–18.0)
MCH: 35.4 pg — AB (ref 26.0–34.0)
MCHC: 34 g/dL (ref 32.0–36.0)
MCV: 104 fL — ABNORMAL HIGH (ref 80.0–100.0)
PLATELETS: 144 10*3/uL — AB (ref 150–440)
RBC: 2.43 MIL/uL — ABNORMAL LOW (ref 4.40–5.90)
RDW: 15.2 % — ABNORMAL HIGH (ref 11.5–14.5)
WBC: 9.1 10*3/uL (ref 3.8–10.6)

## 2015-08-19 LAB — BASIC METABOLIC PANEL
ANION GAP: 5 (ref 5–15)
BUN: 28 mg/dL — ABNORMAL HIGH (ref 6–20)
CO2: 24 mmol/L (ref 22–32)
Calcium: 8.2 mg/dL — ABNORMAL LOW (ref 8.9–10.3)
Chloride: 115 mmol/L — ABNORMAL HIGH (ref 101–111)
Creatinine, Ser: 1.21 mg/dL (ref 0.61–1.24)
GFR calc Af Amer: >60 mL/min (ref >60)
GFR calc non Af Amer: 53 mL/min — ABNORMAL LOW (ref >60)
GLUCOSE: 140 mg/dL — AB (ref 65–99)
POTASSIUM: 4.2 mmol/L (ref 3.5–5.1)
Sodium: 144 mmol/L (ref 135–145)

## 2015-08-19 LAB — GLUCOSE, POCT (MANUAL RESULT ENTRY): POC GLUCOSE: 116 mg/dL — AB (ref 70–99)

## 2015-08-19 LAB — VITAMIN D 25 HYDROXY (VIT D DEFICIENCY, FRACTURES): VIT D 25 HYDROXY: 20.8 ng/mL — AB (ref 30.0–100.0)

## 2015-08-19 SURGERY — COMPRESSION HIP
Anesthesia: Monitor Anesthesia Care | Laterality: Left

## 2015-08-19 MED ORDER — SODIUM CHLORIDE 0.45 % IV SOLN
INTRAVENOUS | Status: DC
  Administered 2015-08-19: 19:00:00 via INTRAVENOUS

## 2015-08-19 MED ORDER — FERROUS SULFATE 325 (65 FE) MG PO TABS
325.0000 mg | ORAL_TABLET | Freq: Every day | ORAL | Status: DC
  Administered 2015-08-20 – 2015-08-22 (×2): 325 mg via ORAL
  Filled 2015-08-19 (×3): qty 1

## 2015-08-19 MED ORDER — ONDANSETRON HCL 4 MG/2ML IJ SOLN: 4.0000 mg | Freq: Four times a day (QID) | INTRAMUSCULAR | Status: DC | PRN

## 2015-08-19 MED ORDER — ZOLPIDEM TARTRATE 5 MG PO TABS: 5.0000 mg | ORAL_TABLET | Freq: Every evening | ORAL | Status: DC | PRN

## 2015-08-19 MED ORDER — NEOMYCIN-POLYMYXIN B GU 40-200000 IR SOLN
Status: AC
  Filled 2015-08-19: qty 4

## 2015-08-19 MED ORDER — ONDANSETRON HCL 4 MG PO TABS: 4.0000 mg | ORAL_TABLET | Freq: Four times a day (QID) | ORAL | Status: DC | PRN

## 2015-08-19 MED ORDER — METOCLOPRAMIDE HCL 5 MG PO TABS: 5.0000 mg | ORAL_TABLET | Freq: Three times a day (TID) | ORAL | Status: DC | PRN

## 2015-08-19 MED ORDER — BUPIVACAINE-EPINEPHRINE 0.25% -1:200000 IJ SOLN
INTRAMUSCULAR | Status: DC | PRN
  Administered 2015-08-19: 30 mL

## 2015-08-19 MED ORDER — ENOXAPARIN SODIUM 30 MG/0.3ML ~~LOC~~ SOLN: 30.0000 mg | SUBCUTANEOUS | Status: DC

## 2015-08-19 MED ORDER — KETAMINE HCL 50 MG/ML IJ SOLN
INTRAMUSCULAR | Status: DC | PRN
  Administered 2015-08-19 (×2): 50 mg via INTRAMUSCULAR
  Administered 2015-08-19: 100 mg via INTRAMUSCULAR

## 2015-08-19 MED ORDER — ACETAMINOPHEN 325 MG PO TABS: 650.0000 mg | ORAL_TABLET | Freq: Four times a day (QID) | ORAL | Status: DC | PRN

## 2015-08-19 MED ORDER — FLEET ENEMA 7-19 GM/118ML RE ENEM: 1.0000 | ENEMA | Freq: Once | RECTAL | Status: DC | PRN

## 2015-08-19 MED ORDER — LACTATED RINGERS IV SOLN
INTRAVENOUS | Status: DC | PRN
  Administered 2015-08-19 (×2): via INTRAVENOUS

## 2015-08-19 MED ORDER — MIDAZOLAM HCL 2 MG/2ML IJ SOLN
INTRAMUSCULAR | Status: DC | PRN
Start: 2015-08-19 — End: 2015-08-19
  Administered 2015-08-19: 2 mg via INTRAVENOUS

## 2015-08-19 MED ORDER — BUPIVACAINE-EPINEPHRINE (PF) 0.25% -1:200000 IJ SOLN
INTRAMUSCULAR | Status: AC
  Filled 2015-08-19: qty 30

## 2015-08-19 MED ORDER — SENNA 8.6 MG PO TABS
1.0000 | ORAL_TABLET | Freq: Two times a day (BID) | ORAL | Status: DC
Start: 2015-08-19 — End: 2015-08-22
  Administered 2015-08-19 – 2015-08-22 (×4): 8.6 mg via ORAL
  Filled 2015-08-19 (×5): qty 1

## 2015-08-19 MED ORDER — BISACODYL 10 MG RE SUPP
10.0000 mg | Freq: Every day | RECTAL | Status: DC | PRN
  Administered 2015-08-21: 10 mg via RECTAL
  Filled 2015-08-19: qty 1

## 2015-08-19 MED ORDER — HYDROCODONE-ACETAMINOPHEN 5-325 MG PO TABS
1.0000 | ORAL_TABLET | Freq: Four times a day (QID) | ORAL | Status: DC | PRN
  Administered 2015-08-20 – 2015-08-22 (×5): 1 via ORAL
  Filled 2015-08-19 (×5): qty 1

## 2015-08-19 MED ORDER — NEOMYCIN-POLYMYXIN B GU 40-200000 IR SOLN
Status: DC | PRN
  Administered 2015-08-19: 4 mL

## 2015-08-19 MED ORDER — PROPOFOL 500 MG/50ML IV EMUL
INTRAVENOUS | Status: DC | PRN
  Administered 2015-08-19: 75 ug/kg/min via INTRAVENOUS

## 2015-08-19 MED ORDER — METOCLOPRAMIDE HCL 5 MG/ML IJ SOLN: 5.0000 mg | Freq: Three times a day (TID) | INTRAMUSCULAR | Status: DC | PRN

## 2015-08-19 MED ORDER — ALUM & MAG HYDROXIDE-SIMETH 200-200-20 MG/5ML PO SUSP: 30.0000 mL | ORAL | Status: DC | PRN

## 2015-08-19 MED ORDER — VANCOMYCIN HCL IN DEXTROSE 1-5 GM/200ML-% IV SOLN
1000.0000 mg | Freq: Two times a day (BID) | INTRAVENOUS | Status: AC
  Administered 2015-08-20: 1000 mg via INTRAVENOUS
  Filled 2015-08-19: qty 200

## 2015-08-19 MED ORDER — MORPHINE SULFATE (PF) 2 MG/ML IV SOLN
0.5000 mg | INTRAVENOUS | Status: DC | PRN
  Administered 2015-08-20: 0.5 mg via INTRAVENOUS
  Filled 2015-08-19: qty 1

## 2015-08-19 MED ORDER — ACETAMINOPHEN 500 MG PO TABS
1000.0000 mg | ORAL_TABLET | Freq: Four times a day (QID) | ORAL | Status: AC
  Administered 2015-08-20 (×2): 1000 mg via ORAL
  Filled 2015-08-19 (×2): qty 2

## 2015-08-19 MED ORDER — VANCOMYCIN HCL IN DEXTROSE 1-5 GM/200ML-% IV SOLN
1000.0000 mg | Freq: Once | INTRAVENOUS | Status: AC
  Administered 2015-08-19: 1000 mg via INTRAVENOUS
  Filled 2015-08-19: qty 200

## 2015-08-19 MED ORDER — MENTHOL 3 MG MT LOZG: 1.0000 | LOZENGE | OROMUCOSAL | Status: DC | PRN

## 2015-08-19 MED ORDER — ENOXAPARIN SODIUM 30 MG/0.3ML ~~LOC~~ SOLN
30.0000 mg | Freq: Two times a day (BID) | SUBCUTANEOUS | Status: DC
  Administered 2015-08-20 – 2015-08-22 (×5): 30 mg via SUBCUTANEOUS
  Filled 2015-08-19 (×5): qty 0.3

## 2015-08-19 MED ORDER — MAGNESIUM HYDROXIDE 400 MG/5ML PO SUSP: 30.0000 mL | Freq: Every day | ORAL | Status: DC | PRN

## 2015-08-19 MED ORDER — PHENOL 1.4 % MT LIQD: 1.0000 | OROMUCOSAL | Status: DC | PRN

## 2015-08-19 MED ORDER — ACETAMINOPHEN 650 MG RE SUPP
650.0000 mg | Freq: Four times a day (QID) | RECTAL | Status: DC | PRN
  Administered 2015-08-20: 650 mg via RECTAL
  Filled 2015-08-19: qty 1

## 2015-08-19 MED ORDER — BUPIVACAINE HCL (PF) 0.5 % IJ SOLN
INTRAMUSCULAR | Status: DC | PRN
  Administered 2015-08-19: 2 mL via INTRATHECAL

## 2015-08-19 SURGICAL SUPPLY — 36 items
BAG COUNTER SPONGE EZ (MISCELLANEOUS) ×2 IMPLANT
BIT DRILL GRAY 5X250 (MISCELLANEOUS) IMPLANT
CANISTER SUCT 1200ML W/VALVE (MISCELLANEOUS) ×3 IMPLANT
CANNULA 5.75X7 CRYSTAL CLEAR (CANNULA) ×3 IMPLANT
CHLORAPREP W/TINT 26ML (MISCELLANEOUS) ×3 IMPLANT
COUNTER SPONGE BAG EZ (MISCELLANEOUS) ×1
DRSG AQUACEL AG ADV 3.5X10 (GAUZE/BANDAGES/DRESSINGS) ×6 IMPLANT
GAUZE PETRO XEROFOAM 1X8 (MISCELLANEOUS) IMPLANT
GAUZE SPONGE 4X4 12PLY STRL (GAUZE/BANDAGES/DRESSINGS) IMPLANT
GLOVE BIO SURGEON STRL SZ7 (GLOVE) ×6 IMPLANT
GLOVE INDICATOR 8.0 STRL GRN (GLOVE) ×3 IMPLANT
GLOVE SURG ORTHO 8.5 STRL (GLOVE) ×6 IMPLANT
GOWN STRL REUS W/ TWL LRG LVL3 (GOWN DISPOSABLE) ×2 IMPLANT
GOWN STRL REUS W/TWL LRG LVL3 (GOWN DISPOSABLE) ×4
HEMOVAC 400CC 10FR (MISCELLANEOUS) IMPLANT
KIT RM TURNOVER STRD PROC AR (KITS) ×3 IMPLANT
MAT BLUE FLOOR 46X72 FLO (MISCELLANEOUS) ×3 IMPLANT
NEEDLE FILTER BLUNT 18X 1/2SAF (NEEDLE) ×2
NEEDLE FILTER BLUNT 18X1 1/2 (NEEDLE) ×1 IMPLANT
NEEDLE HYPO 18GX1.5 BLUNT FILL (NEEDLE) ×3 IMPLANT
NEEDLE SPNL 18GX3.5 QUINCKE PK (NEEDLE) ×3 IMPLANT
PACK HIP COMPR (MISCELLANEOUS) ×3 IMPLANT
PAD GROUND ADULT SPLIT (MISCELLANEOUS) ×3 IMPLANT
PLATE DHS 135 DEG/4HOLE (Plate) ×3 IMPLANT
SCREW CORTEX ST 4.5X44 (Screw) ×9 IMPLANT
SCREW CORTEX ST 4.5X46 (Screw) ×3 IMPLANT
SCREW LAG DHS 90 (Screw) ×3 IMPLANT
SOL PREP PVP 2OZ (MISCELLANEOUS) ×3
SOLUTION PREP PVP 2OZ (MISCELLANEOUS) ×1 IMPLANT
STAPLER SKIN PROX 35W (STAPLE) ×3 IMPLANT
SUCTION FRAZIER TIP 10 FR DISP (SUCTIONS) ×3 IMPLANT
SUT QUILL PDO 0 36 36 VIOLET (SUTURE) ×3 IMPLANT
SUT QUILL PDO 2 24X24 VLT (SUTURE) ×3 IMPLANT
SYR 30ML LL (SYRINGE) ×3 IMPLANT
SYRINGE 10CC LL (SYRINGE) ×3 IMPLANT
TAPE MICROFOAM 4IN (TAPE) IMPLANT

## 2015-08-19 NOTE — Progress Notes (Signed)
Dr Hyacinth Meeker ordered vanc 1 gram iv to start stat for or per telephone order.

## 2015-08-19 NOTE — Progress Notes (Signed)
Anticoag Monitoring: Patient admitted for hip fracture, s/p repair. Ordered Lovenox  SQ q24h for DVT prevention.  Est. CrCl=9ml/min, Weight=79.8kg  Will increase dose to Lovenox  SQ q12h, no renal adjustment is needed for this patient.  Clovia Cuff, PharmD, BCPS 08/19/2015 6:25 PM

## 2015-08-19 NOTE — Anesthesia Preprocedure Evaluation (Signed)
Anesthesia Evaluation  Patient identified by MRN, date of birth, ID band Patient awake    Reviewed: Allergy & Precautions, H&P , NPO status , Patient's Chart, lab work & pertinent test results  History of Anesthesia Complications Negative for: history of anesthetic complications  Airway Mallampati: III  TM Distance: >3 FB Neck ROM: limited    Dental  (+) Poor Dentition, Chipped, Missing, Edentulous Upper   Pulmonary neg pulmonary ROS,    Pulmonary exam normal breath sounds clear to auscultation       Cardiovascular Exercise Tolerance: Good hypertension, (-) angina+ CAD and + Past MI  Normal cardiovascular exam Rhythm:regular Rate:Normal     Neuro/Psych negative neurological ROS  negative psych ROS   GI/Hepatic negative GI ROS, Neg liver ROS,   Endo/Other  diabetes, Type 2  Renal/GU CRFRenal disease  negative genitourinary   Musculoskeletal   Abdominal   Peds  Hematology negative hematology ROS (+)   Anesthesia Other Findings Past Medical History:   Hypertension                                                 DM (diabetes mellitus) (HCC)                                 Hyperlipemia                                                 CKD (chronic kidney disease)                                 Anemia                                                       MI, old                                                      Reproductive/Obstetrics negative OB ROS                             Anesthesia Physical Anesthesia Plan  ASA: III  Anesthesia Plan: MAC and Spinal   Post-op Pain Management:    Induction:   Airway Management Planned:   Additional Equipment:   Intra-op Plan:   Post-operative Plan:   Informed Consent: I have reviewed the patients History and Physical, chart, labs and discussed the procedure including the risks, benefits and alternatives for the proposed anesthesia with  the patient or authorized representative who has indicated his/her understanding and acceptance.   Dental Advisory Given  Plan Discussed with: Anesthesiologist, CRNA and Surgeon  Anesthesia Plan Comments:         Anesthesia Quick Evaluation

## 2015-08-19 NOTE — Anesthesia Procedure Notes (Signed)
Spinal Patient location during procedure: OR Start time: 08/19/2015 2:25 PM End time: 08/19/2015 2:27 PM Staffing Anesthesiologist: Katy Fitch K Performed by: anesthesiologist  Preanesthetic Checklist Completed: patient identified, site marked, surgical consent, pre-op evaluation, timeout performed, IV checked, risks and benefits discussed and monitors and equipment checked Spinal Block Patient position: right lateral decubitus Prep: ChloraPrep Patient monitoring: heart rate, continuous pulse ox, blood pressure and cardiac monitor Approach: midline Location: L4-5 Injection technique: single-shot Needle Needle type: Whitacre and Introducer  Needle gauge: 25 G Needle length: 9 cm Assessment Sensory level: T12 Additional Notes Negative paresthesia. Negative blood return. Positive free-flowing CSF. Expiration date of kit checked and confirmed. Patient tolerated procedure well, without complications.

## 2015-08-19 NOTE — Op Note (Signed)
08/18/2015 - 08/19/2015  4:06 PM  PATIENT:  Evan Fernandez    PRE-OPERATIVE DIAGNOSIS:  Base neck fracture left hip  POST-OPERATIVE DIAGNOSIS:  Same  PROCEDURE:  COMPRESSION HIP NAILING LEFT HIP  SURGEON:  Valinda Hoar, MD  ANESTHESIA:   Spinal  PREOPERATIVE INDICATIONS:  Evan Fernandez is a  81 y.o. male with a diagnosis of fractured left hip who elected for surgical management.    The risks benefits and alternatives were discussed with the patient preoperatively including but not limited to the risks of infection, bleeding, nerve injury, cardiopulmonary complications, the need for revision surgery, among others, and the patient was willing to proceed.  EBL: 200 CC  TOURNIQUET TIME: NONE  OPERATIVE IMPLANTS: 135*/4 HOLE PLATE, 90MM DHS, 4 CORTICAL SCREWS  OPERATIVE FINDINGS: Displaced basal neck fracture left hip  OPERATIVE PROCEDURE: The patient was brought to the operating room and underwent satisfactory spinal anesthesia, and was then placed on the operating room table in the supine position.  The left leg was flexed and abducted and  was placed under traction and internally rotated, and placed in the traction device.  Fluoroscopy showed satisfactory positioning of the fracture fragments.  The operative site was infiltrated with quarter percent Sensorcaine with epinephrine.  IV antibiotics were given.  The hip was prepped and draped in a sterile fashion.  A longitudinal incision was made starting at the base of the trochanter extending distally.  This was deepened through subcutaneous tissue and fascia.  The muscle was split bluntly  exposing the lateral cortex of the femur.  A quarter inch drill hole was made in the lateral cortex and a guide pin placed by hand.  This was adjusted under fluoroscopy into good position in the head and neck.  The step cut reamer was then advanced to the appropriate depth.  The tap was then introduced and removed.  The above DHS screw was then  introduced along with the designated plate.  Traction was released and the plate was impacted.  It was then fixed to the lateral shaft of the femur with cortical screws.  Fluoroscopy showed good final positioning of the fracture fragments and hardware.  After irrigation, the fascia was closed with #2 Quill suture.  The subcutaneous tissue was closed with 0 Quill.  Skin was closed with staples.  An Aquacel dressing was applied.  Sponge and needle count was correct.  Patient was taken out of traction and transferred to the hospital bed in good condition.  Valinda Hoar, MD

## 2015-08-19 NOTE — Progress Notes (Signed)
Discontinue clindamycin and ancef iv for on call to or per Dr. Hyacinth Meeker via phone.

## 2015-08-19 NOTE — Progress Notes (Signed)
[facility] - [place] at [facility]   PATIENT NAME: Evan Fernandez    MR#:  [id number]  DATE OF BIRTH:  11/03/1928  SUBJECTIVE:  CHIEF COMPLAINT:   Chief Complaint  Patient presents with  . Fall    have c/o pain in hip. Waiting for surgery.  REVIEW OF SYSTEMS:  CONSTITUTIONAL: No fever, fatigue or weakness.  EYES: No blurred or double vision.  EARS, NOSE, AND THROAT: No tinnitus or ear pain.  RESPIRATORY: No cough, shortness of breath, wheezing or hemoptysis.  CARDIOVASCULAR: No chest pain, orthopnea, edema.  GASTROINTESTINAL: No nausea, vomiting, diarrhea or abdominal pain.  GENITOURINARY: No dysuria, hematuria.  ENDOCRINE: No polyuria, nocturia,  HEMATOLOGY: No anemia, easy bruising or bleeding SKIN: No rash or lesion. MUSCULOSKELETAL: positive for joint pain or arthritis.   NEUROLOGIC: No tingling, numbness, weakness.  PSYCHIATRY: No anxiety or depression.   ROS  DRUG ALLERGIES:  No Known Allergies  VITALS:  Blood pressure 150/65, pulse 92, temperature 98 F (36.7 C), temperature source Oral, resp. rate 18, height 6' (1.829 m), weight 79.833 kg (176 lb), SpO2 97 %.  PHYSICAL EXAMINATION:  GENERAL:  81 y.o.-year-old patient lying in the bed with no acute distress.  EYES: Pupils equal, round, reactive to light and accommodation. No scleral icterus. Extraocular muscles intact.  HEENT: Head atraumatic, normocephalic. Oropharynx and nasopharynx clear.  NECK:  Supple, no jugular venous distention. No thyroid enlargement, no tenderness.  LUNGS: Normal breath sounds bilaterally, no wheezing, rales,rhonchi or crepitation. No use of accessory muscles of respiration.  CARDIOVASCULAR: S1, S2 normal. No murmurs, rubs, or gallops.  ABDOMEN: Soft, nontender, nondistended. Bowel sounds present. No organomegaly or mass.  EXTREMITIES: No pedal edema, cyanosis, or clubbing.  NEUROLOGIC: Cranial nerves II through XII are intact. Muscle strength 5/5 in all  extremities except left lower- not moving due to pain in hip. Sensation intact. Gait not checked.  PSYCHIATRIC: The patient is alert and oriented x 3.  SKIN: No obvious rash, lesion, or ulcer.   Physical Exam LABORATORY PANEL:   CBC  Recent Labs Lab 08/19/15 0438  WBC 9.1  HGB 8.6*  HCT 25.3*  PLT 144*   ------------------------------------------------------------------------------------------------------------------  Chemistries   Recent Labs Lab 08/18/15 1046 08/19/15 0438  NA 140 144  K 4.5 4.2  CL 110 115*  CO2 22 24  GLUCOSE 121* 140*  BUN 22* 28*  CREATININE 1.17 1.21  CALCIUM 8.6* 8.2*  AST 36  --   ALT 22  --   ALKPHOS 165*  --   BILITOT 1.2  --    ------------------------------------------------------------------------------------------------------------------  Cardiac Enzymes No results for input(s): TROPONINI in the last 168 hours. ------------------------------------------------------------------------------------------------------------------  RADIOLOGY:  Dg Chest Portable 1 View  08/18/2015   CLINICAL DATA:  Preop chest x-ray for left hip surgery.  EXAM: PORTABLE CHEST - 1 VIEW  COMPARISON:  Two-view chest x-ray 04/14/2014.  FINDINGS: The heart is enlarged. Bilateral pleural effusions are noted. Bibasilar airspace disease likely reflects atelectasis. Atherosclerotic changes are noted at the aortic arch. There is no other significant edema. The upper lung fields are clear. The lung volumes are low.  IMPRESSION: 1. Small bilateral pleural effusions and associated atelectasis. 2. Mild cardiomegaly is stable. 3. Atherosclerosis. 4. Low lung volumes.   Electronically Signed   By: Marin Roberts M.D.   On: 08/18/2015 12:17   Dg Hip Unilat With Pelvis 2-3 Views Left  08/18/2015   CLINICAL DATA:  Larey Seat on Wednesday, in bed since, LEFT hip and groin  pain  EXAM: DG HIP (WITH OR WITHOUT PELVIS) 2-3V LEFT  COMPARISON:  None  FINDINGS: Osseous demineralization.   Symmetric hip and SI joints.  Impacted basicervical proximal LEFT femoral fracture.  Question of nondisplaced lesser trochanteric fracture is raised.  No additional femoral fracture or dislocation seen.  Pelvis appears intact.  Degenerative disc disease changes inferior lumbar spine.  IMPRESSION: Impacted basicervical LEFT femoral neck fracture with question nondisplaced lesser trochanteric fracture.   Electronically Signed   By: Ulyses Southward M.D.   On: 08/18/2015 11:29    ASSESSMENT AND PLAN:   Active Problems:   Hip fracture  Patient is a 81 year old white male status post fall with hip fracture  1. Hip fracture patient at moderate risk of complications but no current cardiopulmonary symptoms. At this time may proceed to the OR plan for him to go to the OR today.  2. Recurrent falls unclear if patient is having syncope   on telemetry monitor , echocardiogram done, normal EF , no wall motion abnormalities.  3. Diabetes type 2 I'll hold his oral medications place him on sliding scale insulin due to planned surgery  4. Hyperlipidemia continue him lovastatin  5. Miscellaneous recommend Lovenox or heparin for DVT prophylaxis postop  6. Acute blood loss anemia   Transfuse as per protocol from surgical team.   All the records are reviewed and case discussed with Care Management/Social Workerr. Management plans discussed with the patient, family and they are in agreement.  CODE STATUS: full  TOTAL TIME TAKING CARE OF THIS PATIENT: 35 minutes.     POSSIBLE D/C IN 2-3 DAYS, DEPENDING ON CLINICAL CONDITION.   Altamese Dilling M.D on 08/19/2015   Between 7am to 6pm - Pager - [phone]  After 6pm go to [email or web address] - password [email or web address] [facility]  [facility]  Office  [phone]  CC: Primary care physician; Derwood Kaplan, MD  Note: This dictation was prepared with Dragon dictation along with smaller phrase technology. Any transcriptional errors that result from  this process are unintentional.

## 2015-08-19 NOTE — OR Nursing (Signed)
1620  Oral airway out

## 2015-08-19 NOTE — Transfer of Care (Signed)
Immediate Anesthesia Transfer of Care Note  Patient: Evan Fernandez  Procedure(s) Performed: Procedure(s): COMPRESSION HIP (Left)  Patient Location: PACU  Anesthesia Type:Spinal  Level of Consciousness: sedated  Airway & Oxygen Therapy: Patient Spontanous Breathing and Patient connected to face mask oxygen  Post-op Assessment: Report given to RN  Post vital signs: Reviewed  Last Vitals:  Filed Vitals:   08/19/15 1111  BP: 150/65  Pulse: 92  Temp: 36.7 C  Resp: 18    Complications: No apparent anesthesia complications

## 2015-08-19 NOTE — OR Nursing (Signed)
[id number]  Patient arrives with oral air way in place

## 2015-08-20 LAB — BASIC METABOLIC PANEL
Anion gap: 7 (ref 5–15)
BUN: 33 mg/dL — ABNORMAL HIGH (ref 6–20)
CALCIUM: 7.8 mg/dL — AB (ref 8.9–10.3)
CO2: 21 mmol/L — AB (ref 22–32)
CREATININE: 1.27 mg/dL — AB (ref 0.61–1.24)
Chloride: 113 mmol/L — ABNORMAL HIGH (ref 101–111)
GFR calc Af Amer: 58 mL/min — ABNORMAL LOW (ref >60)
GFR calc non Af Amer: 50 mL/min — ABNORMAL LOW (ref >60)
GLUCOSE: 172 mg/dL — AB (ref 65–99)
Potassium: 4.2 mmol/L (ref 3.5–5.1)
Sodium: 141 mmol/L (ref 135–145)

## 2015-08-20 LAB — CBC
HEMATOCRIT: 23.9 % — AB (ref 40.0–52.0)
HEMOGLOBIN: 7.9 g/dL — AB (ref 13.0–18.0)
MCH: 34.9 pg — ABNORMAL HIGH (ref 26.0–34.0)
MCHC: 33.3 g/dL (ref 32.0–36.0)
MCV: 104.8 fL — ABNORMAL HIGH (ref 80.0–100.0)
Platelets: 163 10*3/uL (ref 150–440)
RBC: 2.28 MIL/uL — ABNORMAL LOW (ref 4.40–5.90)
RDW: 15.3 % — ABNORMAL HIGH (ref 11.5–14.5)
WBC: 7.5 10*3/uL (ref 3.8–10.6)

## 2015-08-20 MED ORDER — CITRUS CALCIUM/VITAMIN D 200-250 MG-UNIT PO TABS
2.0000 | ORAL_TABLET | Freq: Two times a day (BID) | ORAL | Status: DC
  Administered 2015-08-21 – 2015-08-22 (×2): 2 via ORAL
  Filled 2015-08-20 (×6): qty 2

## 2015-08-20 MED ORDER — CALCIUM CITRATE-VITAMIN D 500-400 MG-UNIT PO CHEW
1.0000 | CHEWABLE_TABLET | Freq: Two times a day (BID) | ORAL | Status: DC
  Filled 2015-08-20: qty 1

## 2015-08-20 MED ORDER — CALCIUM CITRATE-VITAMIN D 500-400 MG-UNIT PO CHEW: 1.0000 | CHEWABLE_TABLET | Freq: Two times a day (BID) | ORAL | Status: DC

## 2015-08-20 NOTE — Evaluation (Deleted)
Physical Therapy Evaluation Patient Details Name: Evan Fernandez MRN: [id number] DOB: 09-15-1928 Today's Date: 08/20/2015   History of Present Illness  Pt with fall, R hip fx needing hemiarthroplasty  Clinical Impression  Pt is able to answer most questions appropriately, and then forgets he is in the hospital or gets very off topic.  Family reports that he seems more confused than normal.  Pt shows some effort the exercises apart from PT, but is pain limited and generally is quite weak.  He maintains standing balance and takes a few small steps but is limited and relatively unsafe with ambulation.      Follow Up Recommendations SNF    Equipment Recommendations  Rolling walker with 5" wheels    Recommendations for Other Services       Precautions / Restrictions Precautions Precautions: Posterior Hip;Fall Restrictions Weight Bearing Restrictions: Yes LLE Weight Bearing: Partial weight bearing      Mobility  Bed Mobility Overal bed mobility: Needs Assistance Bed Mobility: Supine to Sit;Sit to Supine     Supine to sit: Mod assist;Max assist Sit to supine: Max assist      Transfers Overall transfer level: Needs assistance Equipment used: Rolling walker (2 wheeled) Transfers: Sit to/from Stand Sit to Stand: Mod assist         General transfer comment: Pt needs much cuing for set up and to encourage him to get up.  Ambulation/Gait Ambulation/Gait assistance: Max assist;Mod assist Ambulation Distance (Feet): 4 Feet Assistive device: Rolling walker (2 wheeled)       General Gait Details: Pt able to take a few small side steps along EOB and then ~3 very small steps forward and back but ultimately was hesitant and limited with ambualtion today.  Stairs            Wheelchair Mobility    Modified Rankin (Stroke Patients Only)       Balance                                             Pertinent Vitals/Pain Pain Assessment: 0-10 Pain  Score: 7  Pain Location: R hip    Home Living Family/patient expects to be discharged to:: Skilled nursing facility Living Arrangements: Spouse/significant other               Additional Comments: ALF    Prior Function Level of Independence: Independent with assistive device(s)         Comments: apparently he would go down to the gym and exercises weekly, did use SPC     Hand Dominance        Extremity/Trunk Assessment   Upper Extremity Assessment: Generalized weakness           Lower Extremity Assessment: RLE deficits/detail RLE Deficits / Details: Pt with some AROM in R LE, but is pain limited and shows grossly 3-/5 strength       Communication   Communication: No difficulties  Cognition Arousal/Alertness: Awake/alert Behavior During Therapy: Impulsive Overall Cognitive Status:  (pt with mild confusion t/o eval)                      General Comments      Exercises Total Joint Exercises Ankle Circles/Pumps: AROM;10 reps Quad Sets: Strengthening;10 reps Heel Slides: AAROM;10 reps Hip ABduction/ADduction: AAROM;AROM;10 reps      Assessment/Plan  PT Assessment Patient needs continued PT services  PT Diagnosis Difficulty walking;Generalized weakness;Acute pain   PT Problem List Decreased strength;Decreased range of motion;Decreased activity tolerance;Decreased mobility;Decreased balance;Decreased coordination;Decreased safety awareness;Decreased knowledge of precautions;Decreased knowledge of use of DME;Decreased cognition  PT Treatment Interventions DME instruction;Gait training;Functional mobility training;Therapeutic activities;Therapeutic exercise;Balance training;Neuromuscular re-education   PT Goals (Current goals can be found in the Care Plan section) Acute Rehab PT Goals Patient Stated Goal: "Just want to get out of here" PT Goal Formulation: With patient Time For Goal Achievement: 09/03/15 Potential to Achieve Goals: Fair     Frequency BID   Barriers to discharge        Co-evaluation               End of Session Equipment Utilized During Treatment: Gait belt Activity Tolerance: Patient limited by fatigue;Patient limited by pain             Time: 1610-9604 PT Time Calculation (min) (ACUTE ONLY): 29 min   Charges:   PT Evaluation $Initial PT Evaluation Tier I: 1 Procedure PT Treatments $Therapeutic Exercise: 8-22 mins   PT G Codes:       Loran Senters, PT, DPT [phone]  Malachi Pro 08/20/2015, 6:44 PM

## 2015-08-20 NOTE — Progress Notes (Signed)
Subjective: 1 Day Post-Op Procedure(s) (LRB): COMPRESSION HIP (Left)    Patient reports pain as mild. Feels better. Ready to get OOB.    Objective:   VITALS:   Filed Vitals:   08/20/15 0802  BP: 133/53  Pulse: 96  Temp: 98.1 F (36.7 C)  Resp: 18    Neurovascular intact Sensation intact distally Intact pulses distally Dorsiflexion/Plantar flexion intact Incision: scant drainage  LABS  Recent Labs  08/18/15 1046 08/19/15 0438 08/20/15 0420  HGB 10.2* 8.6* 7.9*  HCT 30.9* 25.3* 23.9*  WBC 9.2 9.1 7.5  PLT 163 144* 163     Recent Labs  08/18/15 1046 08/19/15 0438 08/20/15 0420  NA 140 144 141  K 4.5 4.2 4.2  BUN 22* 28* 33*  CREATININE 1.17 1.21 1.27*  GLUCOSE 121* 140* 172*     Recent Labs  08/18/15 1046  INR 1.07     Assessment/Plan: 1 Day Post-Op Procedure(s) (LRB): COMPRESSION HIP (Left)   Advance diet Up with therapy Discharge to SNF in 2-3 days

## 2015-08-20 NOTE — Progress Notes (Signed)
Per Dr. Elisabeth Pigeon approved to discontinue telemetry.

## 2015-08-20 NOTE — Progress Notes (Signed)
Patient has had excessive coughing spells after drinking fluids or eating even while sitting in upright position.  Called Dr. Elisabeth Pigeon and advised, he ordered NPO until speech evaluation completed.  Both ordered.  Family advised.

## 2015-08-20 NOTE — Evaluation (Signed)
Physical Therapy Evaluation Patient Details Name: Evan Fernandez MRN: [id number] DOB: Mar 06, 1928 Today's Date: 08/20/2015   History of Present Illness  Pt with fall, R hip fx needing hemiarthroplasty  Clinical Impression  Pt shows good motivation but is very limited with PT exam.  He does fairly well with exercises after formal exam, but during attempt at standing is unable to get to upright and despite total assist is unable to get his knee extended or hips forward.  Pt eager to work further with PT but struggled today.    Follow Up Recommendations SNF    Equipment Recommendations  Rolling walker with 5" wheels    Recommendations for Other Services       Precautions / Restrictions Precautions Precautions: Posterior Hip;Fall Restrictions Weight Bearing Restrictions: Yes LLE Weight Bearing: Partial weight bearing      Mobility  Bed Mobility Overal bed mobility: Needs Assistance Bed Mobility: Supine to Sit;Sit to Supine     Supine to sit: Mod assist;Max assist Sit to supine: Max assist   General bed mobility comments: Pt needs considerable assist to get to EOB, does show good effort  Transfers Overall transfer level: Needs assistance Equipment used: Rolling walker (2 wheeled) Transfers: Sit to/from Stand Sit to Stand: Total assist;Max assist         General transfer comment: unable to get to fully upright despite heavy cuing, assist and family encouragement.  He was unable to get his knees straight of effectively use UEs to push himself up  Ambulation/Gait Ambulation/Gait assistance:  (unable)              Stairs            Wheelchair Mobility    Modified Rankin (Stroke Patients Only)       Balance                                             Pertinent Vitals/Pain Pain Assessment: 0-10 Pain Score: 6  Pain Location: R hip    Home Living Family/patient expects to be discharged to:: Skilled nursing facility Living  Arrangements: Spouse/significant other   Type of Home: House Home Access: Stairs to enter Entrance Stairs-Rails: None Entrance Stairs-Number of Steps: 2          Prior Function Level of Independence: Independent with assistive device(s)         Comments: Pt used a cane, but was driving, active and regularly out of the house     Hand Dominance        Extremity/Trunk Assessment   Upper Extremity Assessment: Generalized weakness;Overall WFL for tasks assessed           Lower Extremity Assessment: RLE deficits/detail RLE Deficits / Details: Pt with some AROM in R LE, but is pain limited and shows grossly 3-/5 strength       Communication   Communication:  (weak, mumbling speech)  Cognition Arousal/Alertness: Awake/alert Behavior During Therapy: WFL for tasks assessed/performed Overall Cognitive Status: Within Functional Limits for tasks assessed                      General Comments      Exercises Total Joint Exercises Ankle Circles/Pumps: AROM;10 reps Quad Sets: Strengthening;10 reps Heel Slides: AAROM;10 reps Hip ABduction/ADduction: AAROM;AROM;10 reps      Assessment/Plan    PT Assessment Patient needs continued  PT services  PT Diagnosis Difficulty walking;Generalized weakness;Acute pain   PT Problem List Decreased strength;Decreased range of motion;Decreased activity tolerance;Decreased mobility;Decreased balance;Decreased coordination;Decreased safety awareness;Decreased knowledge of precautions;Decreased knowledge of use of DME  PT Treatment Interventions DME instruction;Gait training;Functional mobility training;Therapeutic activities;Therapeutic exercise;Balance training;Neuromuscular re-education   PT Goals (Current goals can be found in the Care Plan section) Acute Rehab PT Goals Patient Stated Goal: "I thought I would do better" PT Goal Formulation: With patient Time For Goal Achievement: 09/03/15 Potential to Achieve Goals: Fair     Frequency BID   Barriers to discharge        Co-evaluation               End of Session Equipment Utilized During Treatment: Gait belt Activity Tolerance: Patient limited by fatigue;Patient limited by pain             Time: 7829-5621 PT Time Calculation (min) (ACUTE ONLY): 31 min   Charges:   PT Evaluation $Initial PT Evaluation Tier I: 1 Procedure PT Treatments $Therapeutic Exercise: 8-22 mins   PT G Codes:       Loran Senters, PT, DPT [phone]  Malachi Pro 08/20/2015, 7:29 PM

## 2015-08-20 NOTE — Clinical Social Work Note (Signed)
Clinical Social Work Assessment  Patient Details  Name: Evan Fernandez MRN: [id number] Date of Birth: 1928-09-16  Date of referral:  08/20/15               Reason for consult:  Facility Placement                Permission sought to share information with:  Facility Medical sales representative, Family Supports Permission granted to share information::  Yes, Verbal Permission Granted  Name::     Evan Fernandez [phone] and Evan Fernandez [phone]  Agency::  SNF[facility]  Relationship::     Contact Information:     Housing/Transportation Living arrangements for the past 2 months:  Single Family Home Source of Information:  Patient, Spouse, Medical Team Patient Interpreter Needed:  None Criminal Activity/Legal Involvement Pertinent to Current Situation/Hospitalization:  No - Comment as needed Significant Relationships:  Adult Children Lives with:  Spouse Do you feel safe going back to the place where you live?  No Need for family participation in patient care:  Yes (Comment)  Care giving concerns:  Pt lives with his wife of 65 years. She drives and is able to provide supervision level care only.    Social Worker assessment / plan:  CSW was referred to Pt to assist with dc planning. Pt is married to his wife of 65 years, they have 4 adult children. Pt retired from [employer] work.  Pt reports falling at home and is now in need of SNF for STR at dc. CSW discussed SNF process and potential options in the community. Pt's wife is familiar with some of the facilities in the area and is requesting [facility] which is closest to their home. Pt's wife requested that CSW contact their son Evan Fernandez to discuss plan. CSW will complete Fl2 and send out to area facilities for availability. Pt likely to dc on Tuesday.   Employment status:  Retired Database administrator PT Recommendations:  Skilled Nursing Facility Information / Referral to community resources:  Skilled Nursing  Facility  Patient/Family's Response to care:  Pt has supportive wife at bedside. They are pleased with care at [facility]. Pt is agreeable to SNF at dc and verbalized understanding that it would be dangerous for his wife to assist him at home until he gets more stable.   Patient/Family's Understanding of and Emotional Response to Diagnosis, Current Treatment, and Prognosis:  Pt and his wife are hopeful of Pt's return home after SNF. Pt's adult children are supportive of parents and are assisting with dc plans as needed.   Emotional Assessment Appearance:  Appears stated age Attitude/Demeanor/Rapport:   (cooperative ) Affect (typically observed):  Accepting, Calm, Pleasant Orientation:  Oriented to Place, Oriented to Self, Oriented to  Time, Oriented to Situation Alcohol / Substance use:  Never Used Psych involvement (Current and /or in the community):  No (Comment)  Discharge Needs  Concerns to be addressed:  Adjustment to Illness Readmission within the last 30 days:  No Current discharge risk:  Physical Impairment Barriers to Discharge:  Continued Medical Work up   [employer], LCSW 08/20/2015, 2:35 PM

## 2015-08-20 NOTE — Care Management Note (Signed)
Case Management Note  Patient Details  Name: WINNIE BARSKY MRN: [id number] Date of Birth: 1928-08-19  Subjective/Objective:      81yo Mr Sufian Ravi was admitted 08/19/15 per syncope and a hip fracture after a fall at home. Today is POD#1. Resides at home with wife Keland Peyton. PCP=DR Eason. Pharmacy=[facility] on [address]. Reports that he has a walker and a cane at home, but cannot recall whether walker has wheels. No home oxygen. No home health. Mr Odonoghue appears to be somewhat hard of hearing. Will discuss home health providers with family members. Case Management will follow for discharge planning.               Action/Plan:   Expected Discharge Date:  08/22/15               Expected Discharge Plan:     In-House Referral:     Discharge planning Services     Post Acute Care Choice:    Choice offered to:     DME Arranged:    DME Agency:     HH Arranged:    HH Agency:     Status of Service:     Medicare Important Message Given:    Date Medicare IM Given:    Medicare IM give by:    Date Additional Medicare IM Given:    Additional Medicare Important Message give by:     If discussed at Long Length of Stay Meetings, dates discussed:    Additional Comments:  Jebadiah Imperato A, RN 08/20/2015, 12:41 PM

## 2015-08-20 NOTE — Progress Notes (Signed)
[facility] [place] [facility] [place]   PATIENT NAME: Evan Fernandez    MR#:  [id number]  DATE OF BIRTH:  1927-12-19  SUBJECTIVE:  CHIEF COMPLAINT:   Chief Complaint  Patient presents with  . Fall   S/p sx left hip nailing for femoral neck fracture.( 08/19/15)   Mild pain, tolerating diet.  REVIEW OF SYSTEMS:  CONSTITUTIONAL: No fever, fatigue or weakness.  EYES: No blurred or double vision.  EARS, NOSE, AND THROAT: No tinnitus or ear pain.  RESPIRATORY: No cough, shortness of breath, wheezing or hemoptysis.  CARDIOVASCULAR: No chest pain, orthopnea, edema.  GASTROINTESTINAL: No nausea, vomiting, diarrhea or abdominal pain.  GENITOURINARY: No dysuria, hematuria.  ENDOCRINE: No polyuria, nocturia,  HEMATOLOGY: No anemia, easy bruising or bleeding SKIN: No rash or lesion. MUSCULOSKELETAL: positive for lt hip joint pain or arthritis.   NEUROLOGIC: No tingling, numbness, weakness.  PSYCHIATRY: No anxiety or depression.   ROS  DRUG ALLERGIES:  No Known Allergies  VITALS:  Blood pressure 133/53, pulse 96, temperature 98.1 F (36.7 C), temperature source Oral, resp. rate 18, height 6' (1.829 m), weight 79.833 kg (176 lb), SpO2 93 %.  PHYSICAL EXAMINATION:  GENERAL:  81 y.o.-year-old patient lying in the bed with no acute distress.  EYES: Pupils equal, round, reactive to light and accommodation. No scleral icterus. Extraocular muscles intact.  HEENT: Head atraumatic, normocephalic. Oropharynx and nasopharynx clear. Conjunctiva pale. NECK:  Supple, no jugular venous distention. No thyroid enlargement, no tenderness.  LUNGS: Normal breath sounds bilaterally, no wheezing, rales,rhonchi or crepitation. No use of accessory muscles of respiration.  CARDIOVASCULAR: S1, S2 normal. No murmurs, rubs, or gallops.  ABDOMEN: Soft, nontender, nondistended. Bowel sounds present. No organomegaly or mass.  EXTREMITIES: No pedal edema, cyanosis, or clubbing.  NEUROLOGIC:  Cranial nerves II through XII are intact. Muscle strength 5/5 in all extremities except left lower- not moving due to pain in hip. Sensation intact. Gait not checked.  PSYCHIATRIC: The patient is alert and oriented x 3.  SKIN: No obvious rash, lesion, or ulcer.   Physical Exam LABORATORY PANEL:   CBC  Recent Labs Lab 08/20/15 0420  WBC 7.5  HGB 7.9*  HCT 23.9*  PLT 163   ------------------------------------------------------------------------------------------------------------------  Chemistries   Recent Labs Lab 08/18/15 1046  08/20/15 0420  NA 140  < > 141  K 4.5  < > 4.2  CL 110  < > 113*  CO2 22  < > 21*  GLUCOSE 121*  < > 172*  BUN 22*  < > 33*  CREATININE 1.17  < > 1.27*  CALCIUM 8.6*  < > 7.8*  AST 36  --   --   ALT 22  --   --   ALKPHOS 165*  --   --   BILITOT 1.2  --   --   < > = values in this interval not displayed. ------------------------------------------------------------------------------------------------------------------  Cardiac Enzymes No results for input(s): TROPONINI in the last 168 hours. ------------------------------------------------------------------------------------------------------------------  RADIOLOGY:  Dg Hip Operative Unilat With Pelvis Left  08/19/2015   CLINICAL DATA:  Status post screw and plate fixation for basicervical femoral neck fracture  EXAM: OPERATIVE LEFT HIP   2 VIEWS  TECHNIQUE: Fluoroscopic spot image(s) were submitted for interpretation post-operatively.  FLUOROSCOPY TIME:  0 MINUTES, 30 SECONDS.  3 IMAGES ACQUIRED.  COMPARISON:  August 18, 2015  FINDINGS: Frontal and lateral views were obtained. There is screw and plate fixation through a fracture of the basicervical region of  the proximal left femur. Alignment is essentially anatomic. The tip of the screws in the proximal femoral head. No new fracture. No dislocation. There is moderate narrowing of the left hip joint.  IMPRESSION: Screw and plate fixation  through a fracture of the left femoral neck with alignment essentially anatomic. No dislocation. Tip of screw in proximal femoral head region.   Electronically Signed   By: Bretta Bang III M.D.   On: 08/19/2015 15:53    ASSESSMENT AND PLAN:   Active Problems:   Hip fracture [facility])  Patient is a 81 year old white male status post fall with hip fracture  1. Hip fracture     S/p left hip nailing 08/19/15.    Manage per ortho. Need rehab.    Will give Vit D + ca tab.  2. Recurrent falls unclear if patient is having syncope   on telemetry monitor , echocardiogram done, normal EF , no wall motion abnormalities.  3. Diabetes type 2 I'll hold his oral medications place him on sliding scale insulin   4. Hyperlipidemia continue him lovastatin  5. Miscellaneous recommend Lovenox or heparin for DVT prophylaxis postop  6. Acute blood loss anemia   Transfuse as per protocol from surgical team.     Oral iron supplement.   All the records are reviewed and case discussed with Care Management/Social Workerr. Management plans discussed with the patient, family and they are in agreement.  CODE STATUS: full  TOTAL TIME TAKING CARE OF THIS PATIENT: 35 minutes.    POSSIBLE D/C IN 2-3 DAYS, DEPENDING ON CLINICAL CONDITION.   Altamese Dilling M.D on 08/20/2015   Between 7am to 6pm - Pager - [phone]  After 6pm go to [email or web address] - password [email or web address] [facility]  [facility]  Office  [phone]  CC: Primary care physician; Derwood Kaplan, MD  Note: This dictation was prepared with Dragon dictation along with smaller phrase technology. Any transcriptional errors that result from this process are unintentional.

## 2015-08-20 NOTE — Anesthesia Postprocedure Evaluation (Signed)
  Anesthesia Post-op Note  Patient: Evan Fernandez  Procedure(s) Performed: Procedure(s): COMPRESSION HIP (Left)  Anesthesia type:MAC, Spinal  Patient location: Floor  Post pain: Pain level controlled  Post assessment: Post-op Vital signs reviewed, Patient's Cardiovascular Status Stable, Respiratory Function Stable, Patent Airway and No signs of Nausea or vomiting  Post vital signs: Reviewed and stable  Last Vitals:  Filed Vitals:   08/20/15 0802  BP: 133/53  Pulse: 96  Temp: 36.7 C  Resp: 18    Level of consciousness: awake, alert  and patient cooperative  Complications: No apparent anesthesia complications

## 2015-08-21 ENCOUNTER — Encounter: Payer: Self-pay | Admitting: Specialist

## 2015-08-21 LAB — CBC
HCT: 22.1 % — ABNORMAL LOW (ref 40.0–52.0)
Hemoglobin: 7.4 g/dL — ABNORMAL LOW (ref 13.0–18.0)
MCH: 34.6 pg — ABNORMAL HIGH (ref 26.0–34.0)
MCHC: 33.5 g/dL (ref 32.0–36.0)
MCV: 103.2 fL — AB (ref 80.0–100.0)
PLATELETS: 171 10*3/uL (ref 150–440)
RBC: 2.14 MIL/uL — AB (ref 4.40–5.90)
RDW: 15.5 % — AB (ref 11.5–14.5)
WBC: 7.5 10*3/uL (ref 3.8–10.6)

## 2015-08-21 LAB — BASIC METABOLIC PANEL
ANION GAP: 5 (ref 5–15)
BUN: 41 mg/dL — ABNORMAL HIGH (ref 6–20)
CALCIUM: 7.7 mg/dL — AB (ref 8.9–10.3)
CO2: 23 mmol/L (ref 22–32)
Chloride: 111 mmol/L (ref 101–111)
Creatinine, Ser: 1.32 mg/dL — ABNORMAL HIGH (ref 0.61–1.24)
GFR, EST AFRICAN AMERICAN: 55 mL/min — AB (ref >60)
GFR, EST NON AFRICAN AMERICAN: 47 mL/min — AB (ref >60)
GLUCOSE: 161 mg/dL — AB (ref 65–99)
POTASSIUM: 4.1 mmol/L (ref 3.5–5.1)
Sodium: 139 mmol/L (ref 135–145)

## 2015-08-21 LAB — GLUCOSE, CAPILLARY: Glucose-Capillary: 274 mg/dL — ABNORMAL HIGH (ref 65–99)

## 2015-08-21 LAB — PREPARE RBC (CROSSMATCH)

## 2015-08-21 LAB — ABO/RH: ABO/RH(D): A NEG

## 2015-08-21 MED ORDER — MUPIROCIN 2 % EX OINT
1.0000 "application " | TOPICAL_OINTMENT | Freq: Two times a day (BID) | CUTANEOUS | Status: DC
  Administered 2015-08-21 – 2015-08-22 (×2): 1 via NASAL
  Filled 2015-08-21: qty 22

## 2015-08-21 MED ORDER — SODIUM CHLORIDE 0.9 % IV SOLN: Freq: Once | INTRAVENOUS | Status: DC

## 2015-08-21 MED ORDER — CHLORHEXIDINE GLUCONATE CLOTH 2 % EX PADS
6.0000 | MEDICATED_PAD | Freq: Every day | CUTANEOUS | Status: DC
  Administered 2015-08-21 – 2015-08-22 (×2): 6 via TOPICAL

## 2015-08-21 MED ORDER — CALCIUM GLUCONATE 10 % IV SOLN
1.0000 g | Freq: Once | INTRAVENOUS | Status: AC
  Administered 2015-08-21: 1 g via INTRAVENOUS
  Filled 2015-08-21: qty 10

## 2015-08-21 NOTE — Progress Notes (Signed)
Patient's wife called and states that patient sometimes drools blood form the mouth. States that he gets it on his shirt collars sometimes and is a rusty color, not bright red. Patient has not had this happen in the hospital.  Dr Desiree Hane notified, MD states to continue to monitor for now.

## 2015-08-21 NOTE — Care Management Important Message (Signed)
Important Message  Patient Details  Name: DOYE MONTILLA MRN: [id number] Date of Birth: 1928/09/22   Medicare Important Message Given:  Yes-second notification given    Seana Underwood A, RN 08/21/2015, 4:01 PM

## 2015-08-21 NOTE — Progress Notes (Signed)
[facility] - [place] at [facility]   PATIENT NAME: Evan Fernandez    MR#:  [id number]  DATE OF BIRTH:  [birth year]  SUBJECTIVE:  CHIEF COMPLAINT:   Chief Complaint  Patient presents with  . Fall   S/p sx left hip nailing for femoral neck fracture.( 08/19/15)   Mild pain, had some coughing yesterday each time with diet. So kept NPO for SLP eval.  Hb dropped- receiving PRBC today.  REVIEW OF SYSTEMS:  CONSTITUTIONAL: No fever, fatigue or weakness.  EYES: No blurred or double vision.  EARS, NOSE, AND THROAT: No tinnitus or ear pain.  RESPIRATORY: No cough, shortness of breath, wheezing or hemoptysis.  CARDIOVASCULAR: No chest pain, orthopnea, edema.  GASTROINTESTINAL: No nausea, vomiting, diarrhea or abdominal pain.  GENITOURINARY: No dysuria, hematuria.  ENDOCRINE: No polyuria, nocturia,  HEMATOLOGY: No anemia, easy bruising or bleeding SKIN: No rash or lesion. MUSCULOSKELETAL: positive for lt hip joint pain or arthritis.   NEUROLOGIC: No tingling, numbness, weakness.  PSYCHIATRY: No anxiety or depression.   ROS  DRUG ALLERGIES:  No Known Allergies  VITALS:  Blood pressure 130/60, pulse 98, temperature 98.1 F (36.7 C), temperature source Oral, resp. rate 20, height 6' (1.829 m), weight 79.833 kg (176 lb), SpO2 94 %.  PHYSICAL EXAMINATION:  GENERAL:  81 y.o.-year-old patient lying in the bed with no acute distress.  EYES: Pupils equal, round, reactive to light and accommodation. No scleral icterus. Extraocular muscles intact.  HEENT: Head atraumatic, normocephalic. Oropharynx and nasopharynx clear. Conjunctiva pale. NECK:  Supple, no jugular venous distention. No thyroid enlargement, no tenderness.  LUNGS: Normal breath sounds bilaterally, no wheezing, rales,rhonchi or crepitation. No use of accessory muscles of respiration.  CARDIOVASCULAR: S1, S2 normal. No murmurs, rubs, or gallops.  ABDOMEN: Soft, nontender, nondistended. Bowel sounds present.  No organomegaly or mass.  EXTREMITIES: No pedal edema, cyanosis, or clubbing.  NEUROLOGIC: Cranial nerves II through XII are intact. Muscle strength 5/5 in all extremities except left lower- not moving due to pain in hip. Sensation intact. Gait not checked.  PSYCHIATRIC: The patient is alert and oriented x 3.  SKIN: No obvious rash, lesion, or ulcer.   Physical Exam LABORATORY PANEL:   CBC  Recent Labs Lab 08/21/15 0344  WBC 7.5  HGB 7.4*  HCT 22.1*  PLT 171   ------------------------------------------------------------------------------------------------------------------  Chemistries   Recent Labs Lab 08/18/15 1046  08/21/15 0344  NA 140  < > 139  K 4.5  < > 4.1  CL 110  < > 111  CO2 22  < > 23  GLUCOSE 121*  < > 161*  BUN 22*  < > 41*  CREATININE 1.17  < > 1.32*  CALCIUM 8.6*  < > 7.7*  AST 36  --   --   ALT 22  --   --   ALKPHOS 165*  --   --   BILITOT 1.2  --   --   < > = values in this interval not displayed. ------------------------------------------------------------------------------------------------------------------  Cardiac Enzymes No results for input(s): TROPONINI in the last 168 hours. ------------------------------------------------------------------------------------------------------------------  RADIOLOGY:  Dg Hip Operative Unilat With Pelvis Left  08/19/2015   CLINICAL DATA:  Status post screw and plate fixation for basicervical femoral neck fracture  EXAM: OPERATIVE LEFT HIP   2 VIEWS  TECHNIQUE: Fluoroscopic spot image(s) were submitted for interpretation post-operatively.  FLUOROSCOPY TIME:  0 MINUTES, 30 SECONDS.  3 IMAGES ACQUIRED.  COMPARISON:  August 18, 2015  FINDINGS: Frontal and  lateral views were obtained. There is screw and plate fixation through a fracture of the basicervical region of the proximal left femur. Alignment is essentially anatomic. The tip of the screws in the proximal femoral head. No new fracture. No dislocation.  There is moderate narrowing of the left hip joint.  IMPRESSION: Screw and plate fixation through a fracture of the left femoral neck with alignment essentially anatomic. No dislocation. Tip of screw in proximal femoral head region.   Electronically Signed   By: Bretta Bang III M.D.   On: 08/19/2015 15:53    ASSESSMENT AND PLAN:   Active Problems:   Hip fracture [facility])  Patient is a 81 year old white male status post fall with hip fracture  1. Hip fracture     S/p left hip nailing 08/19/15.    Manage per ortho. Need rehab.   give Vit D + ca tab.  2. Recurrent falls unclear if patient is having syncope   on telemetry monitor , echocardiogram done, normal EF , no wall motion abnormalities.  3. Diabetes type 2 I'll hold his oral medications place him on sliding scale insulin   4. Hyperlipidemia continue him lovastatin  5. Miscellaneous recommend Lovenox or heparin for DVT prophylaxis postop  6. Acute blood loss anemia   Transfuse today as Hb is 7.4 ( 08/21/15)- discussed with pt about possible side effects of blood transfusion- including more common like low grade fever to rare and serious like renal and lung involvement and infections.  He understood- and due to necessity of transfusion- agreed to receive the transfusion.  Also give Oral iron supplement.  7. Dysphagia   Called SLP evaluation.  All the records are reviewed and case discussed with Care Management/Social Workerr. Management plans discussed with the patient, family and they are in agreement.  CODE STATUS: full  TOTAL TIME TAKING CARE OF THIS PATIENT: 35 minutes.   POSSIBLE D/C IN 2-3 DAYS, DEPENDING ON CLINICAL CONDITION.   Altamese Dilling M.D on 08/21/2015   Between 7am to 6pm - Pager - [phone]  After 6pm go to [email or web address] - password [email or web address] [facility]  [place] [facility]  Office  [phone]  CC: Primary care physician; Derwood Kaplan, MD  Note: This dictation was prepared with  Dragon dictation along with smaller phrase technology. Any transcriptional errors that result from this process are unintentional.

## 2015-08-21 NOTE — Progress Notes (Signed)
First void past foley removed

## 2015-08-21 NOTE — Progress Notes (Signed)
Physical Therapy Treatment Patient Details Name: Evan Fernandez MRN: [id number] DOB: 24-Jul-1928 Today's Date: 08/21/2015    History of Present Illness This paitent is an 81 year old male who came to [facility] after a fall resulting in R hip fracture. He recieved an ORIF nailing repair.    PT Comments    Pt denied pain upon arrival to PT session this morning. Pt is generally difficult to understand when communicating. Pt's nurse was contacted before visit in regards to his low Hemoglobin levels and pending blood transfusion order. Nurse stated that pt was fine to perform all activity and that his vitals were healthy. Pt was mod assist +1 for all bed mobility. Pt uses RLE to assist LLE with abduction to get LE over side of bed. PT provided assist with pt's shoulders to achieve full sitting. Pt able to perform sit <>stand with mod assist +2 and RW. Pt unable to achieve full hip and knee extension despite cueing and demonstrated stance with significant forward trunk flexion. Pt stated that he felt SOB with standing; SpO2% was measured intermittently throughout session and never fell below 92% on room air (HR ~110). Pt will benefit from continued skilled PT services in order to increase his strength, ROM and functional mobility.   Follow Up Recommendations  SNF     Equipment Recommendations  Rolling walker with 5" wheels    Recommendations for Other Services       Precautions / Restrictions Precautions Precautions: Posterior Hip;Fall Restrictions Weight Bearing Restrictions: Yes LLE Weight Bearing: Partial weight bearing    Mobility  Bed Mobility Overal bed mobility: Needs Assistance Bed Mobility: Supine to Sit;Sit to Supine     Supine to sit: Mod assist Sit to supine: Mod assist   General bed mobility comments: Pt able to achieve sidelying postion using BUE on bed rail; once in sidelying/partial sidelying pt required mod assist for control of shoulders to complete transition to sitting.  Pt assists his LLE with he RLE in order to move it out to the EOB.  Pt has difficulty actively moving LLE due to pain/guarding.   Transfers Overall transfer level: Needs assistance Equipment used: Rolling walker (2 wheeled) Transfers: Sit to/from Stand Sit to Stand: Mod assist;+2 physical assistance         General transfer comment: unable to achieve full upright standing; pt demonstrated good ability to initiate sit<>stand but once in standing was unable to achieve full hip and knee extension. Pt demonstrates considerable forward trunk flexion in standing. Cues required for picking up his chest and locking out knees and hips.   Ambulation/Gait Ambulation/Gait assistance:  (unable to perform)               Stairs            Wheelchair Mobility    Modified Rankin (Stroke Patients Only)       Balance Overall balance assessment: Needs assistance Sitting-balance support: Bilateral upper extremity supported;Feet supported Sitting balance-Leahy Scale: Fair     Standing balance support: Bilateral upper extremity supported Standing balance-Leahy Scale: Poor                      Cognition Arousal/Alertness: Awake/alert Behavior During Therapy: WFL for tasks assessed/performed Overall Cognitive Status: Within Functional Limits for tasks assessed                      Exercises Total Joint Exercises Ankle Circles/Pumps: AROM;20 reps;Both;Supine Quad Sets: Strengthening;Left;15 reps;Supine Gluteal  Sets: Strengthening;Left;15 reps;Supine Heel Slides: AROM;Left;10 reps;Supine    General Comments        Pertinent Vitals/Pain Pain Assessment:  (pt did not state pain. )    Home Living                      Prior Function            PT Goals (current goals can now be found in the care plan section)      Frequency  BID    PT Plan Current plan remains appropriate    Co-evaluation             End of Session Equipment Utilized  During Treatment: Gait belt Activity Tolerance: Patient limited by fatigue;Patient limited by pain Patient left: in bed;with call bell/phone within reach;with bed alarm set     Time: 1005-1035 PT Time Calculation (min) (ACUTE ONLY): 30 min  Charges:                       G CodesGeorgina Peer 08/21/2015, 10:50 AM

## 2015-08-21 NOTE — Clinical Social Work Placement (Signed)
   CLINICAL SOCIAL WORK PLACEMENT  NOTE  Date:  08/21/2015  Patient Details  Name: Evan Fernandez MRN: [id number] Date of Birth: February 06, 1928  Clinical Social Work is seeking post-discharge placement for this patient at the Skilled  Nursing Facility level of care (*CSW will initial, date and re-position this form in  chart as items are completed):  Yes   Patient/family provided with [place] Clinical Social Work Department's list of facilities offering this level of care within the geographic area requested by the patient (or if unable, by the patient's family).  Yes   Patient/family informed of their freedom to choose among providers that offer the needed level of care, that participate in Medicare, Medicaid or managed care program needed by the patient, have an available bed and are willing to accept the patient.  Yes   Patient/family informed of [facility]'s ownership interest in [facility] and [facility], as well as of the fact that they are under no obligation to receive care at these facilities.  PASRR submitted to EDS on 08/21/15     PASRR number received on 08/21/15     Existing PASRR number confirmed on       FL2 transmitted to all facilities in geographic area requested by pt/family on 08/21/15     FL2 transmitted to all facilities within larger geographic area on       Patient informed that his/her managed care company has contracts with or will negotiate with certain facilities, including the following:        Yes   Patient/family informed of bed offers received.  Patient chooses bed at  [facility] )     Physician recommends and patient chooses bed at      Patient to be transferred to   on  .  Patient to be transferred to facility by       Patient family notified on   of transfer.  Name of family member notified:        PHYSICIAN Please sign FL2     Additional Comment:    _______________________________________________ Haig Prophet, LCSW 08/21/2015, 1:01 PM

## 2015-08-21 NOTE — Care Management Note (Signed)
Case Management Note  Patient Details  Name: Evan Fernandez MRN: [id number] Date of Birth: 12-08-1927  Subjective/Objective:      PT recommends SNF and Dr Smiley Houseman note mentions anticipated discharge to SNF. [facility] social work is working on facility options. Wife is requesting that [name] go to Rehab at  [facility].              Action/Plan:   Expected Discharge Date:  08/22/15               Expected Discharge Plan:     In-House Referral:     Discharge planning Services     Post Acute Care Choice:    Choice offered to:     DME Arranged:    DME Agency:     HH Arranged:    HH Agency:     Status of Service:     Medicare Important Message Given:  Yes-second notification given Date Medicare IM Given:    Medicare IM give by:    Date Additional Medicare IM Given:    Additional Medicare Important Message give by:     If discussed at Long Length of Stay Meetings, dates discussed:    Additional Comments:  Jaimin Krupka A, RN 08/21/2015, 12:14 PM

## 2015-08-21 NOTE — Evaluation (Signed)
Clinical/Bedside Swallow Evaluation Patient Details  Name: Evan Fernandez MRN: [id number] Date of Birth: 1928/06/24  Today's Date: 08/21/2015 Time: SLP Start Time (ACUTE ONLY): 0945 SLP Stop Time (ACUTE ONLY): 1015 SLP Time Calculation (min) (ACUTE ONLY): 30 min  Past Medical History:  Past Medical History  Diagnosis Date  . Hypertension   . DM (diabetes mellitus) (HCC)   . Hyperlipemia   . CKD (chronic kidney disease)   . Anemia   . MI, old    Past Surgical History:  Past Surgical History  Procedure Laterality Date  . Thumb amputation     HPI:  Evan Fernandez is a 81 y.o. male with a known history of hypertension, diabetes type 2, hyperlipidemia, chronic kidney disease, anemia and history of old MI according to the patient who apparently has been having episodes of falling. For the past few weeks. Patient has episodes where he kind of just falls he was seen by primary care provider and was told that he might be having TIAs. Patient earlier today went to his mailbox and again had this episode where he can fell. This time he started having pain in the hip and was noted to have hip fracture. Her asked to admit the patient. He denies any chest pain shortness of breath no palpitations no dizziness. Denies any nausea vomiting or diarrhea   Assessment / Plan / Recommendation Clinical Impression  Pt presents with oral pharyngeal dysphagia c/b immediate and overt coughing on thin liquids and poor oral awareness.  Pt coughed immediately and throat cleared on all thin liquid trials.  ST gave trials of nectar thickened water which was tolerated without immediate overt s/s of aspiration. Pt consumed 5-7 bites of apple sauce and 3 bites of banana with no overt/immediate s/s of aspiration. Slightly prolonged oral phase noted on banana mastication (possibly due to missing upper dentation). Poor vocal quality noted throughout ST visit c/b low vocal intensity and dysphonia (possibly due to intubation).  Pt would benefit from a dysphagia 2 diet with nectar thick liquids and should follow aspiration precautions.  Meds in puree (whole). NSG updated. If Pt does not tolerate nectar (coughing etc.), please provide honey thick instead.    Aspiration Risk  Mild    Diet Recommendation Dysphagia 2 (Fine chop);Nectar   Medication Administration: Whole meds with puree Compensations: Small sips/bites;Slow rate;Follow solids with liquid    Other  Recommendations Oral Care Recommendations: Oral care BID;Staff/trained caregiver to provide oral care   Follow Up Recommendations       Frequency and Duration min 2x/week  1 week   Pertinent Vitals/Pain None reported    SLP Swallow Goals  See plan of care   Swallow Study Prior Functional Status   Pt lives at home with wife and consumed regular diet with thin liquids.    General Date of Onset: 09/17/15 Other Pertinent Information: Evan Fernandez is a 81 y.o. male with a known history of hypertension, diabetes type 2, hyperlipidemia, chronic kidney disease, anemia and history of old MI according to the patient who apparently has been having episodes of falling. For the past few weeks. Patient has episodes where he kind of just falls he was seen by primary care provider and was told that he might be having TIAs. Patient earlier today went to his mailbox and again had this episode where he can fell. This time he started having pain in the hip and was noted to have hip fracture. Her asked to admit the patient. He denies any  chest pain shortness of breath no palpitations no dizziness. Denies any nausea vomiting or diarrhea Type of Study: Bedside swallow evaluation Diet Prior to this Study: Regular;Thin liquids Temperature Spikes Noted: No Respiratory Status: Room air History of Recent Intubation: Yes Length of Intubations (days): 1 days Date extubated: 08/19/15 Behavior/Cognition: Alert;Cooperative;Pleasant mood;Distractible;Requires cueing Oral Cavity  - Dentition: Missing dentition (missing upper dentition) Self-Feeding Abilities: Needs assist;Needs set up Patient Positioning: Upright in bed Baseline Vocal Quality: Aphonic;Breathy;Low vocal intensity Volitional Swallow: Able to elicit    Oral/Motor/Sensory Function Overall Oral Motor/Sensory Function: Impaired Labial ROM: Within Functional Limits Labial Symmetry: Within Functional Limits Labial Strength:  (Nt) Labial Sensation:  (NT) Lingual ROM: Within Functional Limits Lingual Symmetry: Within Functional Limits Lingual Strength: Reduced (poor awareness to task) Lingual Sensation:  (NT) Facial ROM: Within Functional Limits Facial Symmetry: Within Functional Limits Facial Strength:  (NT) Facial Sensation:  (NT) Velum:  (NT) Mandible: Within Functional Limits   Ice Chips Ice chips: Within functional limits Presentation: Spoon Other Comments: Pt took 3 ice chips PO with no immediate/overt s/s of aspiration noted.  Poor oral awareness initially (when spoon presented to lips) however appropriate oral management once ice chips enter oral cavity.   Thin Liquid Thin Liquid: Impaired Presentation: Cup Oral Phase Impairments: Reduced labial seal Oral Phase Functional Implications: Right anterior spillage;Left anterior spillage Pharyngeal  Phase Impairments: Cough - Delayed;Cough - Immediate;Throat Clearing - Delayed Other Comments: Pt coughed after trials of water by cup; also demonstrated oral phase deficits in reduced labial seal, prolonged pre-oral phase, and anterior spillage (min). Pt tipped head back and did not drop chin appropriately to sip from cup.   Nectar Thick Nectar Thick Liquid: Within functional limits Presentation: Spoon;Cup Other Comments: Pt consumed 4 sips by tsp and an additional 4-5 by cup with no overt/immediate s/s of aspiration noted on any trials. Mild throat clear noted after trials completed.   Honey Thick Honey Thick Liquid: Not tested   Puree Puree: Within  functional limits Presentation: Spoon Other Comments: Pt consumed 5-7 tsp of applesauce with no immediate/overt s/s of aspiration noted.  Mild oral dysphagia noted when "finding" spoon initially and bringing into mouth (slightly decreased oral awareness).   Solid   GO    Solid: Within functional limits Presentation: Spoon Other Comments: Patient consumed 3 bites of banana by spoon with no immediate/overt s/s of aspiration.  Prolonged oral phase (possibly due to missing upper dentation and not having teeth with him at hospital).       Kentaro Alewine 08/21/2015,11:23 AM

## 2015-08-21 NOTE — Progress Notes (Signed)
Subjective: 2 Days Post-Op Procedure(s) (LRB): COMPRESSION HIP (Left)    Patient reports pain as mild. hgb 7.4 so getting 1 unit prbc.  No other complaints  Objective:   VITALS:   Filed Vitals:   08/21/15 1255  BP: 124/58  Pulse: 103  Temp: 97.8 F (36.6 C)  Resp: 21    Neurologically intact Neurovascular intact Sensation intact distally Intact pulses distally Dorsiflexion/Plantar flexion intact Dressing dry.   LABS  Recent Labs  08/19/15 0438 08/20/15 0420 08/21/15 0344  HGB 8.6* 7.9* 7.4*  HCT 25.3* 23.9* 22.1*  WBC 9.1 7.5 7.5  PLT 144* 163 171     Recent Labs  08/19/15 0438 08/20/15 0420 08/21/15 0344  NA 144 141 139  K 4.2 4.2 4.1  BUN 28* 33* 41*  CREATININE 1.21 1.27* 1.32*  GLUCOSE 140* 172* 161*    No results for input(s): LABPT, INR in the last 72 hours.   Assessment/Plan: 2 Days Post-Op Procedure(s) (LRB): COMPRESSION HIP (Left)   Advance diet Up with therapy Discharge to SNF when stable

## 2015-08-21 NOTE — Progress Notes (Signed)
PT Cancellation Note  Patient Details Name: MENDELL BONTEMPO MRN: [id number] DOB: 24-May-1928   Cancelled Treatment:    Reason Eval/Treat Not Completed: Patient declined, no reason specified. Treatment attempted; pt refuses despite gentle encouragement stating he is not feeling well. Pt currently receiving blood transfusion. Pt asks for a bed pan; nursing contacted on behalf of patient. Re attempt PT tomorrow.    Elsie Stain Bishop 08/21/2015, 1:37 PM

## 2015-08-21 NOTE — Evaluation (Signed)
Occupational Therapy Evaluation Patient Details Name: Evan Fernandez MRN: [id number] DOB: 1928/10/06 Today's Date: 08/21/2015    History of Present Illness This paitent is an 81 year old male who came to [facility] after a fall resulting in L hip fracture. He recieved an ORIF nailing repair.   Clinical Impression   This patient is an 81 year old male who came to [facility] after a fall suffering a L hip fracture. He  received an open reduction with internal fixation repair. He lives * in a  home with his wife.  He had been independent with basic activities of daily living  and functional mobility. He now requires significant assist. He would benefit from Occupational Therapy for ADL/functioal mobility training while maintaining partial weight bearing .     Follow Up Recommendations  SNF    Equipment Recommendations       Recommendations for Other Services       Precautions / Restrictions Precautions Precautions: Fall Restrictions Weight Bearing Restrictions: Yes LLE Weight Bearing: Partial weight bearing      Mobility Bed Mobility  Transfers    Balance                              ADL                                         General ADL Comments: Patient had been independent with basic ADL. He now needs significant assist. Practiced techniques for lower body dressing using hip kit with hand over hand assist and verbal cues for technique.     Vision     Perception     Praxis      Pertinent Vitals/Pain Pain Assessment:      Hand Dominance     Extremity/Trunk Assessment Upper Extremity Assessment Upper Extremity Assessment: Overall WFL for tasks assessed;Generalized weakness (B UE 3+/5)   Lower Extremity Assessment Lower Extremity Assessment: Defer to PT evaluation       Communication     Cognition Arousal/Alertness: Awake/alert Behavior During Therapy: WFL for tasks assessed/performed Overall Cognitive  Status: Within Functional Limits for tasks assessed                     General Comments       Exercises       Shoulder Instructions      Home Living Family/patient expects to be discharged to:: Skilled nursing facility Living Arrangements: Spouse/significant other   Type of Home: House Home Access: Stairs to enter [employer] of Steps: 2 Entrance Stairs-Rails: None                            Prior Functioning/Environment Level of Independence: Independent with assistive device(s)        Comments: quad cane    OT Diagnosis: Generalized weakness   OT Problem List: Decreased strength;Decreased range of motion;Decreased activity tolerance;Impaired balance (sitting and/or standing);Pain   OT Treatment/Interventions: Self-care/ADL training    OT Goals(Current goals can be found in the care plan section) Acute Rehab OT Goals Patient Stated Goal: Get rehab OT Goal Formulation: With patient Time For Goal Achievement: 09/04/15 Potential to Achieve Goals: Good  OT Frequency: Min 1X/week   Barriers to D/C:  Co-evaluation              End of Session Equipment Utilized During Treatment:  (hip kit)  Activity Tolerance:   Patient left: in bed;with call bell/phone within reach;with bed alarm set   Time: 5465-0354 OT Time Calculation (min): 23 min Charges:  OT General Charges $OT Visit: 1 Procedure OT Evaluation $Initial OT Evaluation Tier I: 1 Procedure G-Codes:    Myrene Galas, MS/OTR/L  08/21/2015, 11:39 AM

## 2015-08-21 NOTE — Progress Notes (Addendum)
Clinical Child psychotherapist (CSW) presented bed offers to patient. Patient requested CSW to call his wife. CSW contacted patient's wife Lupe Carney. Wife chose [facility]. Blue Medicare authorization has been approved. Auth # [id number] RVB next review date 10/6. [facility] admissions coordinator at [facility] is aware of above. CSW will continue to follow and assist as needed.   CSW contacted patient's son [name] and made him aware of above. CSW also made son aware that per blue medicare patient will have a $0 co-pay days 1-20, $75 co-pay per day days 21-49 and $100 co-pay per day days 50-100.   Jetta Lout, LCSWA [phone]

## 2015-08-22 LAB — GLUCOSE, CAPILLARY: GLUCOSE-CAPILLARY: 116 mg/dL — AB (ref 65–99)

## 2015-08-22 LAB — CBC
HCT: 25.2 % — ABNORMAL LOW (ref 40.0–52.0)
Hemoglobin: 8.4 g/dL — ABNORMAL LOW (ref 13.0–18.0)
MCH: 33.6 pg (ref 26.0–34.0)
MCHC: 33.5 g/dL (ref 32.0–36.0)
MCV: 100.4 fL — AB (ref 80.0–100.0)
PLATELETS: 177 10*3/uL (ref 150–440)
RBC: 2.51 MIL/uL — AB (ref 4.40–5.90)
RDW: 16.5 % — AB (ref 11.5–14.5)
WBC: 5.8 10*3/uL (ref 3.8–10.6)

## 2015-08-22 MED ORDER — INSULIN ASPART 100 UNIT/ML ~~LOC~~ SOLN: 0.0000 [IU] | Freq: Three times a day (TID) | SUBCUTANEOUS | Status: DC

## 2015-08-22 MED ORDER — HYDROCODONE-ACETAMINOPHEN 5-325 MG PO TABS: 1.0000 | ORAL_TABLET | Freq: Four times a day (QID) | ORAL | Status: AC | PRN

## 2015-08-22 MED ORDER — ASPIRIN 325 MG PO TABS: 325.0000 mg | ORAL_TABLET | Freq: Two times a day (BID) | ORAL | Status: AC

## 2015-08-22 MED ORDER — GLUCERNA SHAKE PO LIQD: 237.0000 mL | Freq: Three times a day (TID) | ORAL | Status: DC

## 2015-08-22 MED ORDER — SENNA 8.6 MG PO TABS: 1.0000 | ORAL_TABLET | Freq: Two times a day (BID) | ORAL | Status: AC

## 2015-08-22 MED ORDER — ZOLPIDEM TARTRATE 5 MG PO TABS: 5.0000 mg | ORAL_TABLET | Freq: Every evening | ORAL | Status: AC | PRN

## 2015-08-22 MED ORDER — CITRUS CALCIUM/VITAMIN D 200-250 MG-UNIT PO TABS: 2.0000 | ORAL_TABLET | Freq: Two times a day (BID) | ORAL | Status: AC

## 2015-08-22 NOTE — Clinical Social Work Placement (Signed)
   CLINICAL SOCIAL WORK PLACEMENT  NOTE  Date:  08/22/2015  Patient Details  Name: Evan Fernandez MRN: [id number] Date of Birth: 23-Jun-1928  Clinical Social Work is seeking post-discharge placement for this patient at the Skilled  Nursing Facility level of care (*CSW will initial, date and re-position this form in  chart as items are completed):  Yes   Patient/family provided with [facility] Clinical Social Work Department's list of facilities offering this level of care within the geographic area requested by the patient (or if unable, by the patient's family).  Yes   Patient/family informed of their freedom to choose among providers that offer the needed level of care, that participate in Medicare, Medicaid or managed care program needed by the patient, have an available bed and are willing to accept the patient.  Yes   Patient/family informed of [facility]'s ownership interest in [facility] and [facility], as well as of the fact that they are under no obligation to receive care at these facilities.  PASRR submitted to EDS on 08/21/15     PASRR number received on 08/21/15     Existing PASRR number confirmed on       FL2 transmitted to all facilities in geographic area requested by pt/family on 08/21/15     FL2 transmitted to all facilities within larger geographic area on       Patient informed that his/her managed care company has contracts with or will negotiate with certain facilities, including the following:        Yes   Patient/family informed of bed offers received.  Patient chooses bed at  [facility] )     Physician recommends and patient chooses bed at      Patient to be transferred to  [facility]  ) on 08/22/15.  Patient to be transferred to facility by  [facility] )     Patient family notified on 08/22/15 of transfer.  Name of family member notified:   (Wife Lurena Joiner and son [name] aware of DC. )     PHYSICIAN        Additional Comment:    _______________________________________________ Haig Prophet, LCSW 08/22/2015, 2:03 PM

## 2015-08-22 NOTE — Progress Notes (Signed)
Physical Therapy Treatment Patient Details Name: Evan Fernandez MRN: [id number] DOB: 12-Jan-1928 Today's Date: 08/22/2015    History of Present Illness This paitent is an 81 year old male who came to [facility] after a fall resulting in R hip fracture. He recieved an ORIF nailing repair.    PT Comments    Pt alert and oriented this a.m. Pt continues to be very weak requiring Mod to Max assist for all functional mobility. Pt demonstrate good left LE movement and isometric strengthening with bed exercises; weak FAQ requiring assist.  Poor compliance with left weightbearing status requiring cues and physical assist to maintain. Pt received up in chair and encouraged to remain up for a couple of hours at least and perform exercises as able in the chair. Plan to see pt this p.m.  Follow Up Recommendations  SNF     Equipment Recommendations  Rolling walker with 5" wheels    Recommendations for Other Services       Precautions / Restrictions Restrictions Weight Bearing Restrictions: Yes LLE Weight Bearing: Partial weight bearing    Mobility  Bed Mobility Overal bed mobility: Needs Assistance Bed Mobility: Supine to Sit     Supine to sit: Mod assist     General bed mobility comments: Requires assist for LEs and trunk. Increased time/effort  Transfers Overall transfer level: Needs assistance Equipment used: Rolling walker (2 wheeled) Transfers: Sit to/from Stand Sit to Stand: Mod assist;From elevated surface (x 2. second attempt with 2nd person to A with ambulation)         General transfer comment: Requires cues for compliance for L PWB and to activate glutes/hip extensors and upright posture  Ambulation/Gait Ambulation/Gait assistance: Max assist;+2 physical assistance Ambulation Distance (Feet): 4 Feet Assistive device: Rolling walker (2 wheeled)       General Gait Details: Requires physical assist to move LLE; non compliance with L PWB requiring assist and constant  cueing. Weak upper body strength making compliance difficult.    Stairs            Wheelchair Mobility    Modified Rankin (Stroke Patients Only)       Balance           Standing balance support: Bilateral upper extremity supported Standing balance-Leahy Scale: Zero                      Cognition Arousal/Alertness: Awake/alert Behavior During Therapy: WFL for tasks assessed/performed Overall Cognitive Status: Within Functional Limits for tasks assessed                      Exercises General Exercises - Lower Extremity Ankle Circles/Pumps: AROM;Both;20 reps;Supine Quad Sets: Strengthening;Both;20 reps;Supine Gluteal Sets: Strengthening;Both;20 reps;Supine Long Arc Quad: AAROM;Left;15 reps;Seated (AROM on R) Heel Slides: AAROM;Left;20 reps;Supine (AROM R) Hip ABduction/ADduction: AAROM;Left;15 reps;Supine (AROM on R)    General Comments        Pertinent Vitals/Pain Pain Assessment: 0-10 Pain Score: 8  Pain Location: L hip/thigh Pain Intervention(s): Monitored during session;RN gave pain meds during session;Repositioned    Home Living                      Prior Function            PT Goals (current goals can now be found in the care plan section) Progress towards PT goals: Progressing toward goals (slowly)    Frequency  BID    PT Plan Current plan  remains appropriate    Co-evaluation             End of Session Equipment Utilized During Treatment: Gait belt Activity Tolerance: Patient limited by fatigue (limited by weakness) Patient left: in chair;with call bell/phone within reach;with chair alarm set;with SCD's reapplied     Time: 0921-0950 PT Time Calculation (min) (ACUTE ONLY): 29 min  Charges:  $Gait Training: 8-22 mins $Therapeutic Exercise: 8-22 mins                    G Codes:      Kristeen Miss 08/22/2015, 10:04 AM

## 2015-08-22 NOTE — Progress Notes (Signed)
Initial Nutrition Assessment   INTERVENTION:   Meals and Snacks: Cater to patient preferences per SLP recommendations Medical Food Supplement Therapy: will recommend Sugar Free Mighty Shakes on meal trays TID for added nutrition (each shake provides 300kcals and 9g protein)   NUTRITION DIAGNOSIS:   Swallowing difficulty related to dysphagia as evidenced by  (diet order, SLP following).   GOAL:   Patient will meet greater than or equal to 90% of their needs  MONITOR:    (Energy Intake, Electrolyte and renal Profile, Anemia Profile, anthropometrics)  REASON FOR ASSESSMENT:    (thickened liquids)    ASSESSMENT:   Pt dmitted with hip fracture S/p left hip nailing 08/19/15. Pt to be discharged today to [facility] healthcare per CM note.  Past Medical History  Diagnosis Date  . Hypertension   . DM (diabetes mellitus) (HCC)   . Hyperlipemia   . CKD (chronic kidney disease)   . Anemia   . MI, old      Diet Order:  DIET DYS 3 Room service appropriate?: Yes with Assist; Fluid consistency:: Nectar Thick Diet - low sodium heart healthy    Current Nutrition: Pt ate 100% of waffle and thickened milk this am.   Food/Nutrition-Related History: Unable to clarify as pt working with SLP on visit. Per MST no decrease in appetite PTA. Limited documentation as pt on isolation.   Medications: senokot, ferrous sulfate, calcium-vitamin D, ss novolog  Electrolyte/Renal Profile and Glucose Profile:   Recent Labs Lab 08/19/15 0438 08/20/15 0420 08/21/15 0344  NA 144 141 139  K 4.2 4.2 4.1  CL 115* 113* 111  CO2 24 21* 23  BUN 28* 33* 41*  CREATININE 1.21 1.27* 1.32*  CALCIUM 8.2* 7.8* 7.7*  GLUCOSE 140* 172* 161*   Protein Profile:  Recent Labs Lab 08/18/15 1046  ALBUMIN 3.3*    Nutritional Anemia Profile:  CBC Latest Ref Rng 08/22/2015 08/21/2015 08/20/2015  WBC 3.8 - 10.6 K/uL 5.8 7.5 7.5  Hemoglobin 13.0 - 18.0 g/dL 0.4(V) 7.4(L) 7.9(L)  Hematocrit 40.0 - 52.0 %  25.2(L) 22.1(L) 23.9(L)  Platelets 150 - 440 K/uL 177 171 163    Gastrointestinal Profile: Last BM:  08/22/2015   Weight Change: Per MST no decrease in wt PTA. Per CHL 2% weight loss in 3 months.   Height:   Ht Readings from Last 1 Encounters:  08/18/15 6' (1.829 m)    Weight:   Wt Readings from Last 1 Encounters:  08/18/15 176 lb (79.833 kg)    Wt Readings from Last 10 Encounters:  08/18/15 176 lb (79.833 kg)  04/25/15 180 lb (81.647 kg)     BMI:  Body mass index is 23.86 kg/(m^2).    EDUCATION NEEDS:   No education needs identified at this time    LOW Care Level  Leda Quail, [place], LDN Pager [phone]

## 2015-08-22 NOTE — Progress Notes (Signed)
Patient is medically stable for D/C to [facility] today. Per [facility] admissions coordinator at [facility] patient is going to room 88. RN will call report and arrange EMS for transport. [facility] Medicare authorization has been received. Clinical Child psychotherapist (CSW) prepared D/C packet and sent D/C Summary to [facility]. Patient is aware of above. CSW contacted patient's wife Lurena Joiner and son [name] and made them aware of above. Please reconsult if future social work needs arise. CSW signing off.   Jetta Lout, LCSWA [phone]

## 2015-08-22 NOTE — Progress Notes (Signed)
Subjective: 3 Days Post-Op Procedure(s) (LRB): COMPRESSION HIP (Left)    Patient reports pain as mild. OOB in cahir and alert.  hgb up to 8.4 Objective:   VITALS:   Filed Vitals:   08/22/15 0722  BP: 143/62  Pulse: 86  Temp: 98.3 F (36.8 C)  Resp: 20    Neurologically intact Neurovascular intact Sensation intact distally Intact pulses distally Dorsiflexion/Plantar flexion intact Incision: moderate drainage -- change dressing today  LABS  Recent Labs  08/20/15 0420 08/21/15 0344 08/22/15 0613  HGB 7.9* 7.4* 8.4*  HCT 23.9* 22.1* 25.2*  WBC 7.5 7.5 5.8  PLT 163 171 177     Recent Labs  08/20/15 0420 08/21/15 0344  NA 141 139  K 4.2 4.1  BUN 33* 41*  CREATININE 1.27* 1.32*  GLUCOSE 172* 161*    No results for input(s): LABPT, INR in the last 72 hours.   Assessment/Plan: 3 Days Post-Op Procedure(s) (LRB): COMPRESSION HIP (Left)   Up with therapy Discharge to SNF tomorrow

## 2015-08-22 NOTE — Discharge Summary (Signed)
[facility]   PATIENT NAME: Evan Fernandez    MR#:  [id number]  DATE OF BIRTH:  03-14-1928  DATE OF ADMISSION:  08/18/2015 ADMITTING PHYSICIAN: Deeann Saint, MD  DATE OF DISCHARGE: 08/22/2015  PRIMARY CARE PHYSICIAN: Derwood Kaplan, MD    ADMISSION DIAGNOSIS:  Fracture, hip, left, closed, initial encounter (HCC) [S72.002A]  DISCHARGE DIAGNOSIS:  Active Problems:   Hip fracture [facility])    S/p surgery  SECONDARY DIAGNOSIS:   Past Medical History  Diagnosis Date  . Hypertension   . DM (diabetes mellitus) (HCC)   . Hyperlipemia   . CKD (chronic kidney disease)   . Anemia   . MI, old     HOSPITAL COURSE:   1. Hip fracture   S/p left hip nailing 08/19/15.  Manage per ortho. Need rehab.  give Vit D + ca tab.  2. Recurrent falls unclear if patient is having syncope  on telemetry monitor , echocardiogram done, normal EF , no wall motion abnormalities.  3. Diabetes type 2     Resume oral meds on d/c.  4. Hyperlipidemia continue him lovastatin  5. Miscellaneous recommend Lovenox or heparin for DVT prophylaxis postop  6. Acute blood loss anemia  Transfuse today as Hb is 7.4 ( 08/21/15)- discussed with pt about possible side effects of blood transfusion- including more common like low grade fever to rare and serious like renal and lung involvement and infections. He understood- and due to necessity of transfusion- agreed to receive the transfusion. Also give Oral iron supplement.    Given 1 unit PRBC on 08/21/15  7. Dysphagia  Called SLP evaluation.    Suggested Dysphagia diet.  DISCHARGE CONDITIONS:   Stable.  CONSULTS OBTAINED:  Treatment Team:  Auburn Bilberry, MD  DRUG ALLERGIES:  No Known Allergies  DISCHARGE MEDICATIONS:   Current Discharge Medication List    START taking these medications   Details  Calcium Citrate-Vitamin D (CITRUS CALCIUM/VITAMIN D) 200-250 MG-UNIT TABS Take 2 tablets by  mouth 2 (two) times daily. Qty: 120 tablet, Refills: 0    HYDROcodone-acetaminophen (NORCO/VICODIN) 5-325 MG tablet Take 1-2 tablets by mouth every 6 (six) hours as needed for moderate pain. Qty: 30 tablet, Refills: 0    senna (SENOKOT) 8.6 MG TABS tablet Take 1 tablet (8.6 mg total) by mouth 2 (two) times daily. Qty: 120 each, Refills: 0    zolpidem (AMBIEN) 5 MG tablet Take 1 tablet (5 mg total) by mouth at bedtime as needed for sleep. Qty: 30 tablet, Refills: 0      CONTINUE these medications which have CHANGED   Details  aspirin 325 MG tablet Take 1 tablet (325 mg total) by mouth 2 (two) times daily. Qty: 60 tablet, Refills: 0      CONTINUE these medications which have NOT CHANGED   Details  allopurinol (ZYLOPRIM) 100 MG tablet Take 100 mg by mouth daily.    buPROPion (WELLBUTRIN) 100 MG tablet Take 100 mg by mouth 2 (two) times daily.    FeAspGl-FeFum-B12-FA-C-Succ Ac (FERREX 28) TABS Take 1 tablet by mouth daily.    glipiZIDE (GLUCOTROL) 5 MG tablet Take 5 mg by mouth 2 (two) times daily before a meal.    lovastatin (MEVACOR) 20 MG tablet Take 20 mg by mouth at bedtime.    metFORMIN (GLUCOPHAGE) 500 MG tablet Take 500 mg by mouth 2 (two) times daily with a meal.    traMADol (ULTRAM) 50 MG tablet Take 50 mg by  mouth every 6 (six) hours as needed.    albuterol (PROVENTIL HFA;VENTOLIN HFA) 108 (90 BASE) MCG/ACT inhaler Inhale 2 puffs into the lungs every 4 (four) hours as needed for wheezing or shortness of breath.      STOP taking these medications     ibuprofen (ADVIL,MOTRIN) 800 MG tablet          DISCHARGE INSTRUCTIONS:    Diet Diet recommendations: Dysphagia 3 (mechanical soft);Nectar-thick liquid Liquids provided via: Cup;No straw Medication Administration: Whole meds with puree Supervision: Patient able to self feed;Intermittent supervision to cue for compensatory strategies Compensations: Small sips/bites;Slow rate;Follow solids with liquid Postural  Changes and/or Swallow Maneuvers: Seated upright 90 degrees  If you experience worsening of your admission symptoms, develop shortness of breath, life threatening emergency, suicidal or homicidal thoughts you must seek medical attention immediately by calling 911 or calling your MD immediately  if symptoms less severe.  You Must read complete instructions/literature along with all the possible adverse reactions/side effects for all the Medicines you take and that have been prescribed to you. Take any new Medicines after you have completely understood and accept all the possible adverse reactions/side effects.   Please note  You were cared for by a hospitalist during your hospital stay. If you have any questions about your discharge medications or the care you received while you were in the hospital after you are discharged, you can call the unit and asked to speak with the hospitalist on call if the hospitalist that took care of you is not available. Once you are discharged, your primary care physician will handle any further medical issues. Please note that NO REFILLS for any discharge medications will be authorized once you are discharged, as it is imperative that you return to your primary care physician (or establish a relationship with a primary care physician if you do not have one) for your aftercare needs so that they can reassess your need for medications and monitor your lab values.    Today   CHIEF COMPLAINT:   Chief Complaint  Patient presents with  . Fall    HISTORY OF PRESENT ILLNESS:  Evan Fernandez  is a 81 y.o. male with a known history of hypertension, diabetes type 2, hyperlipidemia, chronic kidney disease, anemia and history of old MI according to the patient who apparently has been having episodes of falling. For the past few weeks. Patient has episodes where he kind of just falls he was seen by primary care provider and was told that he might be having TIAs. Patient  earlier today went to his mailbox and again had this episode where he can fell. This time he started having pain in the hip and was noted to have hip fracture. Her asked to admit the patient. He denies any chest pain shortness of breath no palpitations no dizziness. Denies any nausea vomiting or diarrhea  VITAL SIGNS:  Blood pressure 143/62, pulse 86, temperature 98.3 F (36.8 C), temperature source Oral, resp. rate 20, height 6' (1.829 m), weight 79.833 kg (176 lb), SpO2 95 %.  I/O:   Intake/Output Summary (Last 24 hours) at 08/22/15 1322 Last data filed at 08/22/15 0900  Gross per 24 hour  Intake    455 ml  Output   1075 ml  Net   -620 ml    PHYSICAL EXAMINATION:   GENERAL: 81 y.o.-year-old patient lying in the bed with no acute distress.  EYES: Pupils equal, round, reactive to light and accommodation. No scleral icterus. Extraocular  muscles intact.  HEENT: Head atraumatic, normocephalic. Oropharynx and nasopharynx clear. Conjunctiva pale. NECK: Supple, no jugular venous distention. No thyroid enlargement, no tenderness.  LUNGS: Normal breath sounds bilaterally, no wheezing, rales,rhonchi or crepitation. No use of accessory muscles of respiration.  CARDIOVASCULAR: S1, S2 normal. No murmurs, rubs, or gallops.  ABDOMEN: Soft, nontender, nondistended. Bowel sounds present. No organomegaly or mass.  EXTREMITIES: No pedal edema, cyanosis, or clubbing.  NEUROLOGIC: Cranial nerves II through XII are intact. Muscle strength 5/5 in all extremities except left lower- not moving due to pain in hip. Sensation intact. Gait not checked.  PSYCHIATRIC: The patient is alert and oriented x 3.  SKIN: No obvious rash, lesion, or ulcer.   DATA REVIEW:   CBC  Recent Labs Lab 08/22/15 0613  WBC 5.8  HGB 8.4*  HCT 25.2*  PLT 177    Chemistries   Recent Labs Lab 08/18/15 1046  08/21/15 0344  NA 140  < > 139  K 4.5  < > 4.1  CL 110  < > 111  CO2 22  < > 23  GLUCOSE 121*  < >  161*  BUN 22*  < > 41*  CREATININE 1.17  < > 1.32*  CALCIUM 8.6*  < > 7.7*  AST 36  --   --   ALT 22  --   --   ALKPHOS 165*  --   --   BILITOT 1.2  --   --   < > = values in this interval not displayed.  Cardiac Enzymes No results for input(s): TROPONINI in the last 168 hours.  Microbiology Results  Results for orders placed or performed during the hospital encounter of 08/18/15  MRSA PCR Screening     Status: Abnormal   Collection Time: 08/18/15  5:11 PM  Result Value Ref Range Status   MRSA by PCR POSITIVE (A) NEGATIVE Final    Comment:        The GeneXpert MRSA Assay (FDA approved for NASAL specimens only), is one component of a comprehensive MRSA colonization surveillance program. It is not intended to diagnose MRSA infection nor to guide or monitor treatment for MRSA infections. CRITICAL RESULT CALLED TO, READ BACK BY AND VERIFIED WITH: ERICA JOHNSON 08/18/15 1905     RADIOLOGY:  No results found.   Management plans discussed with the patient, family and they are in agreement.  CODE STATUS:     Code Status Orders        Start     Ordered   08/19/15 1729  Full code   Continuous     08/19/15 1728      TOTAL TIME TAKING CARE OF THIS PATIENT: 35 minutes.    Altamese Dilling M.D on 08/22/2015 at 1:22 PM  Between 7am to 6pm - Pager - [phone]  After 6pm go to [email or web address] - password [email or web address] [facility]  [place] [facility]  Office  [phone]  CC: Primary care physician; Derwood Kaplan, MD   Note: This dictation was prepared with Dragon dictation along with smaller phrase technology. Any transcriptional errors that result from this process are unintentional.

## 2015-08-22 NOTE — Progress Notes (Signed)
Speech Language Pathology Treatment: Dysphagia  Patient Details Name: Evan Fernandez MRN: [id number] DOB: [birth year] Today's Date: 08/22/2015 Time: 1000-1115 SLP Time Calculation (min) (ACUTE ONLY): 75 min  Assessment / Plan / Recommendation Clinical Impression  Pt presents with oral/phargyngeal phase dysphagia c/b by immediate and delayed coughing and throat clearing on thin liquids. Oral phase deficits noted (i.e., prolonged oral prep phase, poor labial seal, anterior spillage, prolonged A-P transit, and Pt. Does not drop chin to anticipate cup to lips when drinking). Continue with thickened liquids at this time.  NSG consulted, and Pt ate waffles for breakfast with no immediate/overt s/s of aspiration reported. Pt to follow strict aspiration precautions and would benefit from mechanical soft (dysphagia 3) diet with nectar. NSG updated.    HPI Other Pertinent Information: Charvis Lightner is a 81 y.o. male with a known history of hypertension, diabetes type 2, hyperlipidemia, chronic kidney disease, anemia and history of old MI according to the patient who apparently has been having episodes of falling. For the past few weeks. Patient has episodes where he kind of just falls he was seen by primary care provider and was told that he might be having TIAs. Patient earlier today went to his mailbox and again had this episode where he can fell. This time he started having pain in the hip and was noted to have hip fracture. Her asked to admit the patient. He denies any chest pain shortness of breath no palpitations no dizziness. Denies any nausea vomiting or diarrhea   Pertinent Vitals Pain Assessment: No/denies pain Pain Score: 8  Pain Location: L hip/thigh Pain Intervention(s): Monitored during session;RN gave pain meds during session;Repositioned  SLP Plan  Continue with current plan of care    Recommendations Diet recommendations: Dysphagia 3 (mechanical soft);Nectar-thick liquid Liquids  provided via: Cup;No straw Medication Administration: Whole meds with puree Supervision: Patient able to self feed;Intermittent supervision to cue for compensatory strategies Compensations: Small sips/bites;Slow rate;Follow solids with liquid Postural Changes and/or Swallow Maneuvers: Seated upright 90 degrees              Oral Care Recommendations: Oral care BID;Staff/trained caregiver to provide oral care Plan: Continue with current plan of care    GO     Lennette Fader 08/22/2015, 11:29 AM

## 2015-08-22 NOTE — Evaluation (Signed)
Speech Language Pathology Evaluation Patient Details Name: Evan Fernandez MRN: [id number] DOB: 03/07/1928 Today's Date: 08/22/2015 Time:10:00  -  11:15    Problem List:  Patient Active Problem List   Diagnosis Date Noted  . Hip fracture (HCC) 08/18/2015   Past Medical History:  Past Medical History  Diagnosis Date  . Hypertension   . DM (diabetes mellitus) (HCC)   . Hyperlipemia   . CKD (chronic kidney disease)   . Anemia   . MI, old    Past Surgical History:  Past Surgical History  Procedure Laterality Date  . Thumb amputation    . Compression hip screw Left 08/19/2015    Procedure: COMPRESSION HIP;  Surgeon: Deeann Saint, MD;  Location: [facility] ORS;  Service: Orthopedics;  Laterality: Left;   HPI:    Evan Fernandez is a 81 y.o. male with a known history of hypertension, diabetes type 2, hyperlipidemia, chronic kidney disease, anemia and history of old MI according to the patient who apparently has been having episodes of falling. For the past few weeks. Patient has episodes where he kind of just falls he was seen by primary care provider and was told that he might be having TIAs. Patient earlier today went to his mailbox and again had this episode where he can fell. This time he started having pain in the hip and was noted to have hip fracture. Her asked to admit the patient. He denies any chest pain shortness of breath no palpitations no dizziness. Denies any nausea vomiting or diarrhea.  Assessment / Plan / Recommendation Clinical Impression  Pt appeared to present with severe cognitive deficits on all areas of MOCA-Basic screener.   Pt is unable to complete immediate recall of new information and is therefore unable to complete delayed recall (0% accuracy).  This could significantly impact learning and carry-over of new information at rehab with therapies (PT/OT).  Pt is unable to initiate and sequence simple patterns, cannot name more than four fruits (fluency deficit), is  unable to calculate simple functional math (ADL deficit that could impact ability to safely monitor dosage of medications), abstraction/naming/attention are also impaired -- as would be consistent with cognitive change/decline.  Pt was generally oriented to self/time/place and was able to recall many details of his life (long term memory appears intact).  Recommend full neurological evaluation for further cognitive assessment as Pt failed MOCA-Basic screener (cognitive screener -- 11/30 correct).  Pt lives independly with wife at home and was still using riding lawnmower recently -- ST concerned with Pt. safety and ability to comeplete basic ADLs. Upon chart review, no mention of cognitive decline/change (i.e., dementia?).  NSG updated.    SLP Assessment    MOCA-Basic Cognitive Screener   Follow Up Recommendations    Full Neurological Assessment (Pt. Failed MOCA-Basic Screener -- 11/30 correct)   Frequency and Duration   TBD based on neuro eval results.     Pertinent Vitals/Pain Pain Assessment: 0-10 Pain Score: 8  Pain Location: L hip/thigh Pain Intervention(s): Monitored during session;RN gave pain meds during session;Repositioned   SLP Goals    TBD  SLP Evaluation Prior Functioning    Lived at home with wife   Cognition  Overall Cognitive Status: Impaired/Different from baseline Arousal/Alertness: Awake/alert Orientation Level: Oriented X4 Attention:  (attending to [facility], but unable to complete "attention" tasks) Memory: Impaired Memory Impairment: Decreased recall of new information;Decreased short term memory (deficit in immediate & delayed recall & STM) Decreased Short Term Memory: Verbal basic Awareness: Appears  intact Problem Solving: Impaired Executive Function:  (sequencing and initiation deficits on MOCA-Basic) Safety/Judgment: Impaired    Comprehension    Impaired   Expression   Appears intact, but not always appropriate to task  Oral / Motor   Oral deficits noted  in ST swallow assessment (poor awareness of bolus, impaired oral prep stage -- prolonged, poor labial seal, anterior spillage, min oral holding / prologned A-P transit time).  GO     Kenshin Splawn 08/22/2015, 11:20 AM

## 2015-08-22 NOTE — Progress Notes (Signed)
Inpatient Diabetes Program Recommendations  AACE/ADA: New Consensus Statement on Inpatient Glycemic Control (2015)  Target Ranges:  Prepandial:   less than 140 mg/dL      Peak postprandial:   less than 180 mg/dL (1-2 hours)      Critically ill patients:  140 - 180 mg/dL   Review of Glycemic Control:  Results for Evan Fernandez, Evan Fernandez (MRN [id number]) as of 08/22/2015 10:21  Ref. Range 08/21/2015 18:22  Glucose-Capillary Latest Ref Range: 65-99 mg/dL 409 (H)   Diabetes history: Type 2 diabetes Outpatient Diabetes medications: Metformin 500 mg bid, Glucotrol 5 mg bid  Inpatient Diabetes Program Recommendations:    Called and spoke with Dr. Elisabeth Pigeon regarding potential need for CBG's tid with meals and HS plus Novolog correction.  Will follow.  Thanks, Beryl Meager, RN, BC-ADM Inpatient Diabetes Coordinator Pager [phone] (8a-5p)

## 2015-08-22 NOTE — Care Management Important Message (Signed)
Important Message  Patient Details  Name: Evan Fernandez MRN: [id number] Date of Birth: 08/01/1928   Medicare Important Message Given:  Yes-third notification given    Collie Siad, RN 08/22/2015, 9:50 AM

## 2015-08-22 NOTE — Discharge Instructions (Signed)
Diet per Swallow evaluation      Diet recommendations: Dysphagia 3 (mechanical soft);Nectar-thick liquid Liquids provided via: Cup;No straw Medication Administration: Whole meds with puree Supervision: Patient able to self feed;Intermittent supervision to cue for compensatory strategies Compensations: Small sips/bites;Slow rate;Follow solids with liquid Postural Changes and/or Swallow Maneuvers: Seated upright 90 degrees

## 2015-08-23 LAB — TYPE AND SCREEN
ABO/RH(D): A NEG
ANTIBODY SCREEN: NEGATIVE
Unit division: 0

## 2015-09-26 ENCOUNTER — Encounter: Payer: Self-pay | Admitting: Specialist

## 2015-12-20 DEATH — deceased

## 2016-02-27 IMAGING — CR DG HIP (WITH PELVIS) OPERATIVE*L*
1 series · 3 of 3 positions shown · non-contrast
Comparison: August 18, 2015

CLINICAL DATA: Status post screw and plate fixation for
basicervical femoral neck fracture

EXAM:
OPERATIVE LEFT HIP   2 VIEWS
TECHNIQUE: Fluoroscopic spot image(s) were submitted for interpretation
post-operatively.
FLUOROSCOPY TIME:  0 MINUTES, 30 SECONDS.  3 IMAGES ACQUIRED.

[Series 6001: (id) · 3 of 3 slices shown]
[im 1/3]
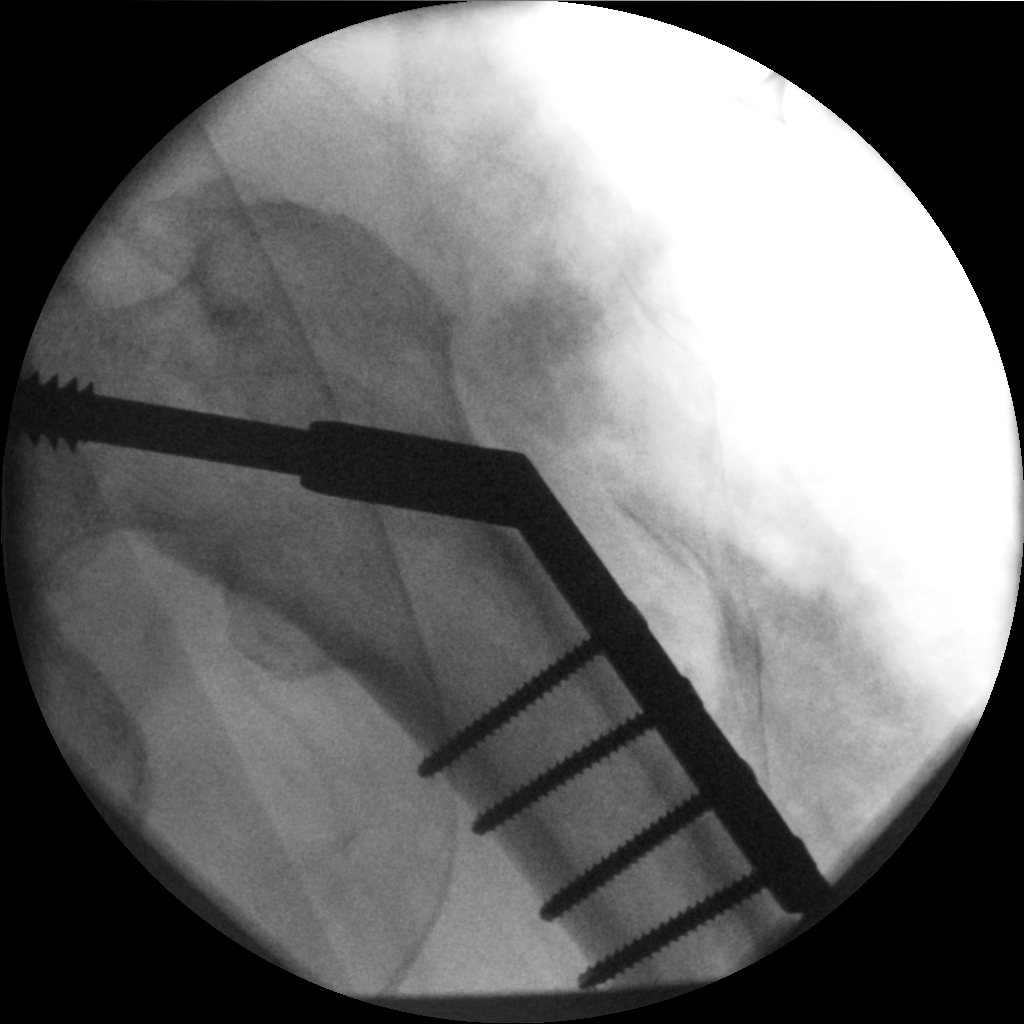
[im 2/3]
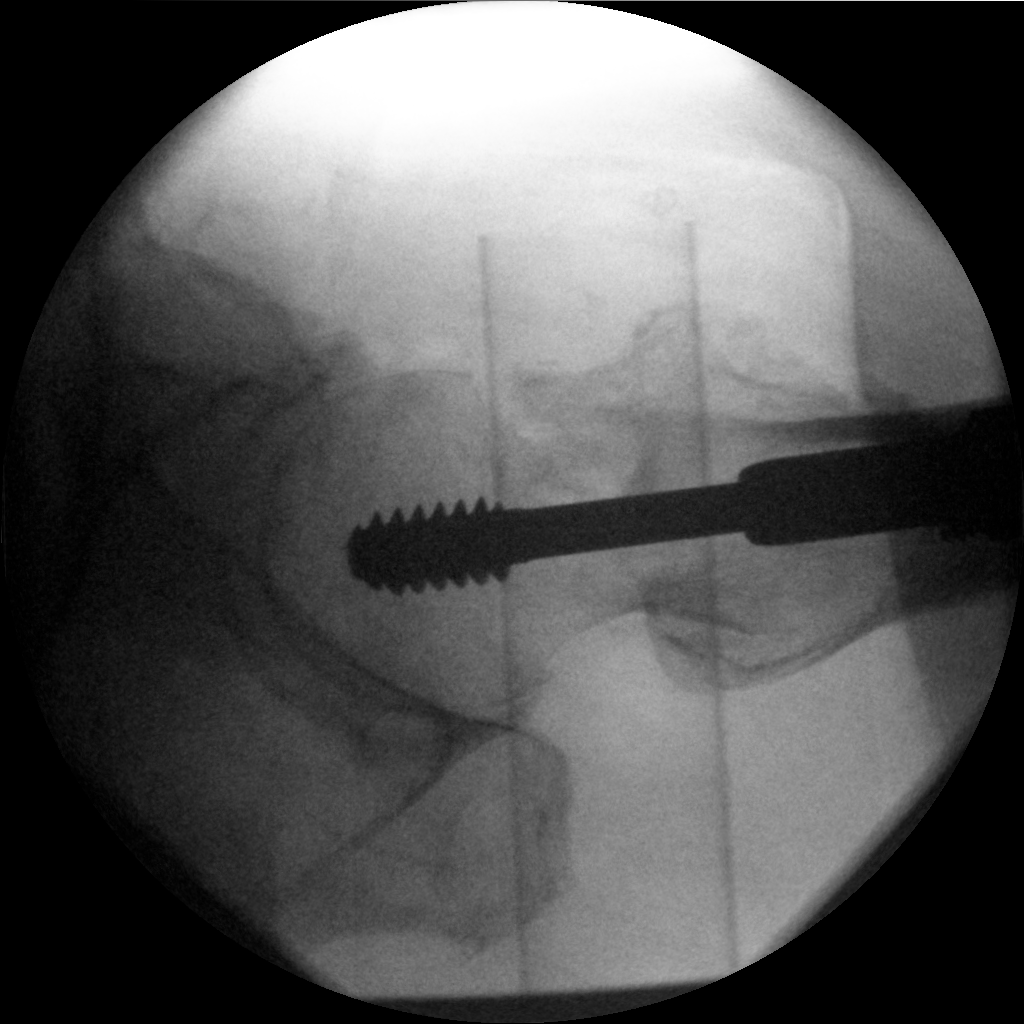
[im 3/3]
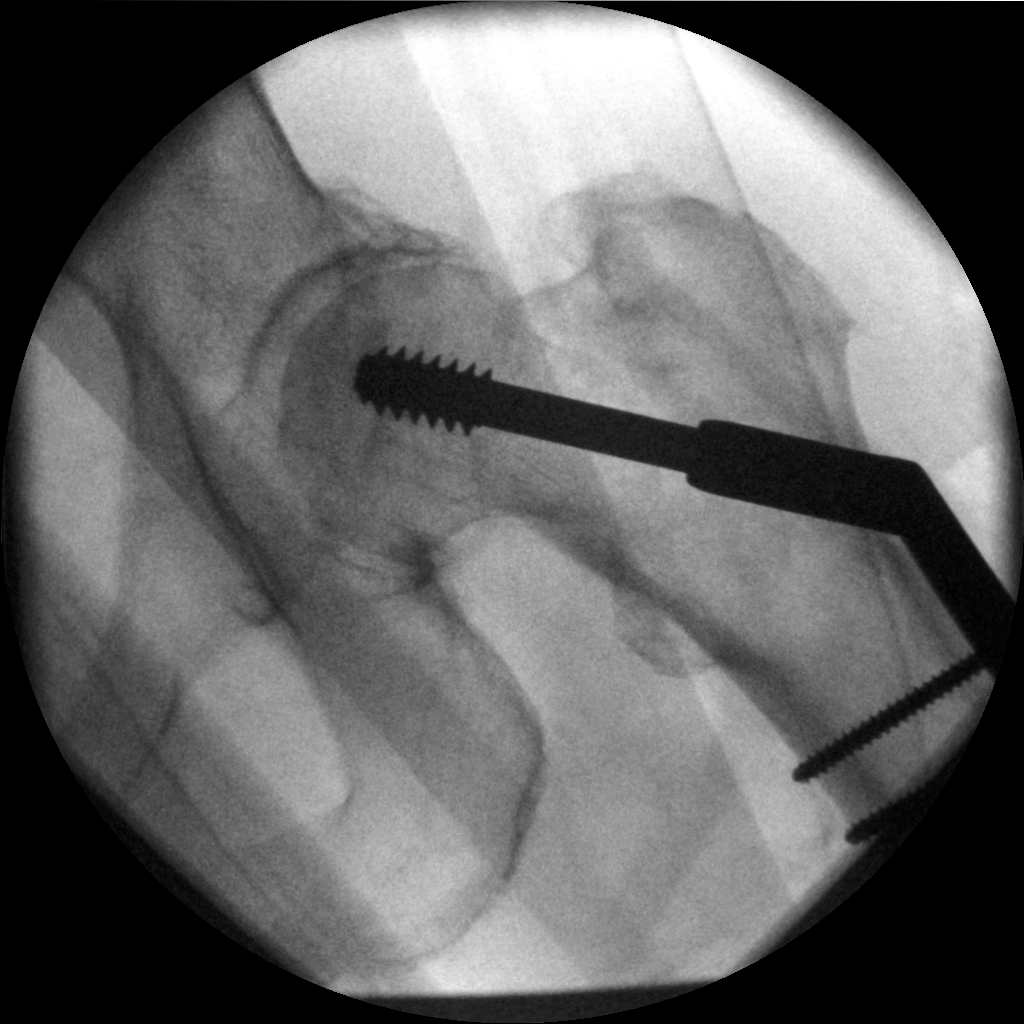

[3 of 3 positions shown; findings below may reference images not displayed]

FINDINGS: Frontal and lateral views were obtained. There is screw and plate
fixation through a fracture of the basicervical region of the
proximal left femur. Alignment is essentially anatomic. The tip of
the screws in the proximal femoral head. No new fracture. No
dislocation. There is moderate narrowing of the left hip joint.
IMPRESSION: Screw and plate fixation through a fracture of the left femoral neck
with alignment essentially anatomic. No dislocation. Tip of screw in
proximal femoral head region.
